# Patient Record
Sex: Male | Born: 1975 | Race: White | Hispanic: No | Marital: Married | Smoking: Never smoker
Health system: Southern US, Community
[De-identification: ages and names within clinical notes are randomized; demographics above are authoritative.]

## PROBLEM LIST (undated history)

## (undated) DIAGNOSIS — K5792 Diverticulitis of intestine, part unspecified, without perforation or abscess without bleeding: Secondary | ICD-10-CM

## (undated) DIAGNOSIS — K219 Gastro-esophageal reflux disease without esophagitis: Secondary | ICD-10-CM

## (undated) DIAGNOSIS — T7840XA Allergy, unspecified, initial encounter: Secondary | ICD-10-CM

## (undated) DIAGNOSIS — E785 Hyperlipidemia, unspecified: Secondary | ICD-10-CM

## (undated) DIAGNOSIS — G4733 Obstructive sleep apnea (adult) (pediatric): Secondary | ICD-10-CM

## (undated) DIAGNOSIS — J302 Other seasonal allergic rhinitis: Secondary | ICD-10-CM

## (undated) HISTORY — DX: Obstructive sleep apnea (adult) (pediatric): G47.33

## (undated) HISTORY — DX: Hyperlipidemia, unspecified: E78.5

## (undated) HISTORY — DX: Gastro-esophageal reflux disease without esophagitis: K21.9

## (undated) HISTORY — DX: Allergy, unspecified, initial encounter: T78.40XA

## (undated) HISTORY — DX: Diverticulitis of intestine, part unspecified, without perforation or abscess without bleeding: K57.92

---

## 2015-03-20 ENCOUNTER — Encounter (HOSPITAL_COMMUNITY): Payer: Self-pay | Admitting: *Deleted

## 2015-03-20 ENCOUNTER — Emergency Department (HOSPITAL_COMMUNITY): Payer: Managed Care, Other (non HMO)

## 2015-03-20 ENCOUNTER — Inpatient Hospital Stay (HOSPITAL_COMMUNITY)
Admission: EM | Admit: 2015-03-20 | Discharge: 2015-03-23 | DRG: 392 | Disposition: A | Discharge status: Home / Self Care | Payer: Managed Care, Other (non HMO) | Attending: General Surgery | Admitting: General Surgery

## 2015-03-20 DIAGNOSIS — E875 Hyperkalemia: Secondary | ICD-10-CM | POA: Diagnosis present

## 2015-03-20 DIAGNOSIS — K631 Perforation of intestine (nontraumatic): Secondary | ICD-10-CM

## 2015-03-20 DIAGNOSIS — K5780 Diverticulitis of intestine, part unspecified, with perforation and abscess without bleeding: Secondary | ICD-10-CM

## 2015-03-20 DIAGNOSIS — R102 Pelvic and perineal pain: Secondary | ICD-10-CM | POA: Diagnosis not present

## 2015-03-20 DIAGNOSIS — K5732 Diverticulitis of large intestine without perforation or abscess without bleeding: Secondary | ICD-10-CM

## 2015-03-20 DIAGNOSIS — K5720 Diverticulitis of large intestine with perforation and abscess without bleeding: Secondary | ICD-10-CM | POA: Diagnosis present

## 2015-03-20 HISTORY — DX: Other seasonal allergic rhinitis: J30.2

## 2015-03-20 LAB — URINALYSIS, ROUTINE W REFLEX MICROSCOPIC
BILIRUBIN URINE: NEGATIVE
GLUCOSE, UA: NEGATIVE mg/dL
Ketones, ur: NEGATIVE mg/dL
Leukocytes, UA: NEGATIVE
Nitrite: NEGATIVE
PROTEIN: NEGATIVE mg/dL
Specific Gravity, Urine: 1.017 (ref 1.005–1.030)
pH: 7.5 (ref 5.0–8.0)

## 2015-03-20 LAB — URINE MICROSCOPIC-ADD ON: BACTERIA UA: NONE SEEN

## 2015-03-20 LAB — COMPREHENSIVE METABOLIC PANEL
ALK PHOS: 102 U/L (ref 38–126)
ALT: 37 U/L (ref 17–63)
ANION GAP: 10 (ref 5–15)
AST: 24 U/L (ref 15–41)
Albumin: 3.2 g/dL — ABNORMAL LOW (ref 3.5–5.0)
BILIRUBIN TOTAL: 0.4 mg/dL (ref 0.3–1.2)
BUN: 12 mg/dL (ref 6–20)
CALCIUM: 9.1 mg/dL (ref 8.9–10.3)
CO2: 27 mmol/L (ref 22–32)
CREATININE: 1.2 mg/dL (ref 0.61–1.24)
Chloride: 99 mmol/L — ABNORMAL LOW (ref 101–111)
GLUCOSE: 100 mg/dL — AB (ref 65–99)
Potassium: 3.7 mmol/L (ref 3.5–5.1)
SODIUM: 136 mmol/L (ref 135–145)
Total Protein: 7.8 g/dL (ref 6.5–8.1)

## 2015-03-20 LAB — CBC
HCT: 41.7 % (ref 39.0–52.0)
Hemoglobin: 13.8 g/dL (ref 13.0–17.0)
MCH: 28.5 pg (ref 26.0–34.0)
MCHC: 33.1 g/dL (ref 30.0–36.0)
MCV: 86.2 fL (ref 78.0–100.0)
PLATELETS: 281 10*3/uL (ref 150–400)
RBC: 4.84 MIL/uL (ref 4.22–5.81)
RDW: 13.1 % (ref 11.5–15.5)
WBC: 17.1 10*3/uL — ABNORMAL HIGH (ref 4.0–10.5)

## 2015-03-20 LAB — LIPASE, BLOOD: Lipase: 26 U/L (ref 11–51)

## 2015-03-20 MED ORDER — ONDANSETRON HCL 4 MG/2ML IJ SOLN: 4.0000 mg | Freq: Four times a day (QID) | INTRAMUSCULAR | Status: DC | PRN

## 2015-03-20 MED ORDER — HYDROMORPHONE HCL 1 MG/ML IJ SOLN
1.0000 mg | Freq: Once | INTRAMUSCULAR | Status: AC
  Administered 2015-03-20: 1 mg via INTRAVENOUS
  Filled 2015-03-20: qty 1

## 2015-03-20 MED ORDER — ACETAMINOPHEN 650 MG RE SUPP: 650.0000 mg | Freq: Four times a day (QID) | RECTAL | Status: DC | PRN

## 2015-03-20 MED ORDER — DIPHENHYDRAMINE HCL 25 MG PO CAPS: 25.0000 mg | ORAL_CAPSULE | Freq: Four times a day (QID) | ORAL | Status: DC | PRN

## 2015-03-20 MED ORDER — FLUTICASONE PROPIONATE 50 MCG/ACT NA SUSP
1.0000 | Freq: Every day | NASAL | Status: DC
  Filled 2015-03-20: qty 16

## 2015-03-20 MED ORDER — ONDANSETRON HCL 4 MG/2ML IJ SOLN
4.0000 mg | Freq: Once | INTRAMUSCULAR | Status: AC
  Administered 2015-03-20: 4 mg via INTRAVENOUS
  Filled 2015-03-20: qty 2

## 2015-03-20 MED ORDER — ZOLPIDEM TARTRATE 5 MG PO TABS: 5.0000 mg | ORAL_TABLET | Freq: Every evening | ORAL | Status: DC | PRN

## 2015-03-20 MED ORDER — ENOXAPARIN SODIUM 40 MG/0.4ML ~~LOC~~ SOLN
40.0000 mg | SUBCUTANEOUS | Status: DC
  Administered 2015-03-20: 40 mg via SUBCUTANEOUS
  Filled 2015-03-20 (×2): qty 0.4

## 2015-03-20 MED ORDER — DIPHENHYDRAMINE HCL 50 MG/ML IJ SOLN: 25.0000 mg | Freq: Four times a day (QID) | INTRAMUSCULAR | Status: DC | PRN

## 2015-03-20 MED ORDER — PIPERACILLIN-TAZOBACTAM 3.375 G IVPB
3.3750 g | Freq: Three times a day (TID) | INTRAVENOUS | Status: DC
  Administered 2015-03-20 – 2015-03-23 (×8): 3.375 g via INTRAVENOUS
  Filled 2015-03-20 (×10): qty 50

## 2015-03-20 MED ORDER — ACETAMINOPHEN 325 MG PO TABS: 650.0000 mg | ORAL_TABLET | Freq: Four times a day (QID) | ORAL | Status: DC | PRN

## 2015-03-20 MED ORDER — SIMETHICONE 80 MG PO CHEW
40.0000 mg | CHEWABLE_TABLET | Freq: Four times a day (QID) | ORAL | Status: DC | PRN
  Administered 2015-03-21: 40 mg via ORAL
  Filled 2015-03-20: qty 1

## 2015-03-20 MED ORDER — POTASSIUM CHLORIDE IN NACL 20-0.9 MEQ/L-% IV SOLN
INTRAVENOUS | Status: DC
  Administered 2015-03-20 – 2015-03-21 (×2): via INTRAVENOUS
  Filled 2015-03-20 (×2): qty 1000

## 2015-03-20 MED ORDER — PANTOPRAZOLE SODIUM 40 MG IV SOLR
40.0000 mg | Freq: Every day | INTRAVENOUS | Status: DC
  Administered 2015-03-20 – 2015-03-22 (×3): 40 mg via INTRAVENOUS
  Filled 2015-03-20 (×3): qty 40

## 2015-03-20 MED ORDER — ONDANSETRON 4 MG PO TBDP: 4.0000 mg | ORAL_TABLET | Freq: Four times a day (QID) | ORAL | Status: DC | PRN

## 2015-03-20 MED ORDER — HYDROCODONE-ACETAMINOPHEN 5-325 MG PO TABS
1.0000 | ORAL_TABLET | Freq: Once | ORAL | Status: DC
  Filled 2015-03-20: qty 1

## 2015-03-20 MED ORDER — AZELASTINE HCL 0.1 % NA SOLN
1.0000 | Freq: Two times a day (BID) | NASAL | Status: DC
  Filled 2015-03-20: qty 30

## 2015-03-20 MED ORDER — HYDROMORPHONE HCL 1 MG/ML IJ SOLN
1.0000 mg | INTRAMUSCULAR | Status: DC | PRN
  Administered 2015-03-20 – 2015-03-21 (×2): 1 mg via INTRAVENOUS
  Filled 2015-03-20 (×3): qty 1

## 2015-03-20 NOTE — ED Notes (Signed)
Pt unable to give urine sample at triage.

## 2015-03-20 NOTE — ED Provider Notes (Signed)
CSN: [id number]     Arrival date & time 03/20/15  1407 History   First MD Initiated Contact with Patient 03/20/15 1526     Chief Complaint  Patient presents with  . Abdominal Pain     (Consider location/radiation/quality/duration/timing/severity/associated sxs/prior Treatment) HPI   Odai Wimmer is a 39 y.o. male with abdominal pain for 9 days. Saw PCP and had blood in urine, treated with Septra. Taking Motrin for pain. He has constipation. Constant suprapubic pain, with pain worse with voiding, at end of stream. No history of kidney stones. Mother has kidney stones. Patient's pain is severe at the time of evaluation. His PCP did a prostate exam. He is evaluated. It was normal. Patient's wife states that his urine culture was negative for bacterial growth. There are no other known modifying factors.    Past Medical History  Diagnosis Date  . Seasonal allergies    History reviewed. No pertinent past surgical history. History reviewed. No pertinent family history. Social History  Substance Use Topics  . Smoking status: Never Smoker   . Smokeless tobacco: None  . Alcohol Use: No    Review of Systems  All other systems reviewed and are negative.     Allergies  Amoxicillin  Home Medications   Prior to Admission medications   Medication Sig Start Date End Date Taking? Authorizing Provider  acetaminophen (TYLENOL) 325 MG tablet Take 650 mg by mouth every 6 (six) hours as needed for mild pain.   Yes Historical Provider, MD  azelastine (ASTELIN) 0.1 % nasal spray Place 1 spray into both nostrils 2 (two) times daily. Use in each nostril as directed   Yes Historical Provider, MD  fluticasone (FLONASE) 50 MCG/ACT nasal spray Place 1 spray into both nostrils daily.   Yes Historical Provider, MD  ibuprofen (ADVIL,MOTRIN) 400 MG tablet Take 400 mg by mouth every 6 (six) hours as needed for mild pain.   Yes Historical Provider, MD  sulfamethoxazole-trimethoprim (BACTRIM DS,SEPTRA DS)  800-160 MG tablet Take 1 tablet by mouth 2 (two) times daily. 7 day supply started 11/29 03/15/15  Yes Historical Provider, MD   BP 123/90 mmHg  Pulse 99  Temp(Src) 98 F (36.7 C) (Oral)  Resp 18  Ht  (1.778 m)  Wt 200 lb 2 oz (90.776 kg)  BMI 28.71 kg/m2  SpO2 99% Physical Exam  Constitutional: He is oriented to person, place, and time. He appears well-developed and well-nourished.  HENT:  Head: Normocephalic and atraumatic.  Right Ear: External ear normal.  Left Ear: External ear normal.  Eyes: Conjunctivae and EOM are normal. Pupils are equal, round, and reactive to light.  Neck: Normal range of motion and phonation normal. Neck supple.  Cardiovascular: Normal rate, regular rhythm and normal heart sounds.   Pulmonary/Chest: Effort normal and breath sounds normal. He exhibits no bony tenderness.  Abdominal: Soft. He exhibits no mass. There is tenderness (right suprapubic, moderate). There is guarding. There is no rebound.  Genitourinary:  No costovertebral angle tenderness with percussion  Musculoskeletal: Normal range of motion.  Neurological: He is alert and oriented to person, place, and time. No cranial nerve deficit or sensory deficit. He exhibits normal muscle tone. Coordination normal.  Skin: Skin is warm, dry and intact.  Psychiatric: He has a normal mood and affect. His behavior is normal. Judgment and thought content normal.  Nursing note and vitals reviewed.   ED Course  Procedures (including critical care time)  Medications  ondansetron (ZOFRAN) injection 4 mg (4  mg Intravenous Given 03/20/15 1722)  HYDROmorphone (DILAUDID) injection 1 mg (1 mg Intravenous Given 03/20/15 1732)  HYDROmorphone (DILAUDID) injection 1 mg (1 mg Intravenous Given 03/20/15 1933)    Patient Vitals for the past 24 hrs:  BP Temp Temp src Pulse Resp SpO2 Height Weight  03/20/15 1830 123/90 mmHg - - 99 - 99 % - -  03/20/15 1800 126/93 mmHg - - - - - - -  03/20/15 1730 137/100 mmHg - -  107 - 99 % - -  03/20/15 1700 120/83 mmHg - - 102 - 100 % - -  03/20/15 1630 131/95 mmHg - - 95 - 100 % - -  03/20/15 1600 131/97 mmHg - - 105 - 97 % - -  03/20/15 1422 139/97 mmHg 98 F (36.7 C) Oral 113 18 98 % 5\' 10"  (1.778 m) 200 lb 2 oz (90.776 kg)   21:25- case discussed with general surgery, Dr. Corliss Skainssuei will evaluate and admit the patient.   7:29 PM Reevaluation with update and discussion. After initial assessment and treatment, an updated evaluation reveals he states his pain is coming back somewhat. Findings discussed with the patient, all questions were answered. Dewane Timson L    Labs Review Labs Reviewed  COMPREHENSIVE METABOLIC PANEL - Abnormal; Notable for the following:    Chloride 99 (*)    Glucose, Bld 100 (*)    Albumin 3.2 (*)    All other components within normal limits  CBC - Abnormal; Notable for the following:    WBC 17.1 (*)    All other components within normal limits  URINALYSIS, ROUTINE W REFLEX MICROSCOPIC (NOT AT [facility]) - Abnormal; Notable for the following:    Hgb urine dipstick MODERATE (*)    All other components within normal limits  URINE MICROSCOPIC-ADD ON - Abnormal; Notable for the following:    Squamous Epithelial / LPF 0-5 (*)    All other components within normal limits  LIPASE, BLOOD    Imaging Review Ct Renal Stone Study  03/20/2015  CLINICAL DATA:  Acute onset of right lower quadrant abdominal pain and hematuria. Initial encounter. EXAM: CT ABDOMEN AND PELVIS WITHOUT CONTRAST TECHNIQUE: Multidetector CT imaging of the abdomen and pelvis was performed following the standard protocol without IV contrast. COMPARISON:  None. FINDINGS: The visualized lung bases are clear. The liver and spleen are unremarkable in appearance. The gallbladder is within normal limits. The pancreas and adrenal glands are unremarkable. The kidneys are unremarkable in appearance. There is no evidence of hydronephrosis. No renal or ureteral stones are seen. No perinephric  stranding is appreciated. No free fluid is identified. The small bowel is unremarkable in appearance. The stomach is within normal limits. No acute vascular abnormalities are seen. Acute diverticulitis is noted along the mid sigmoid colon, with mild wall thickening, diffuse surrounding soft tissue inflammation and trace fluid. There appears to be focal perforation extending superiorly from the mid sigmoid colon, with resultant formation of an abscess at the upper pelvis, measuring approximately 5.3 x 4.0 x 3.3 cm, containing fluid and air. Surrounding soft tissue inflammation is noted extending minimally along the retroperitoneum. The appendix is difficult to fully assess as it tracks adjacent to the abscess, though the normal appearance of the base of the appendix suggests against appendicitis. The colon is otherwise unremarkable in appearance, aside from mild diverticulosis along the sigmoid colon. The bladder is mildly distended and grossly unremarkable. The prostate remains normal in size. No inguinal lymphadenopathy is seen. No acute osseous abnormalities are  identified. IMPRESSION: 1. Acute diverticulitis along the mid sigmoid colon, with diffuse surrounding soft tissue inflammation and trace fluid. Apparent focal perforation extending superiorly from the mid sigmoid colon, with formation of an abscess at the upper pelvis, measuring approximately 5.3 x 4.0 x 3.3 cm, containing fluid and air. Surrounding soft tissue inflammation extends minimally along the retroperitoneum. 2. The appendix is difficult to fully assess, as it tracks adjacent to the abscess, though the normal appearance of the base of the appendix and the tract from the mid sigmoid colon suggests against appendicitis. These results were called by telephone at the time of interpretation on 03/20/2015 at 7:06 pm to Dr. Mancel Bale, who verbally acknowledged these results. Electronically Signed   By: Roanna Raider M.D.   On: 03/20/2015 19:08   I  have personally reviewed and evaluated these images and lab results as part of my medical decision-making.   EKG Interpretation None      MDM   Final diagnoses:  Sigmoid diverticulitis  Intestinal perforation (HCC)    Perforated sigmoid diverticulitis, with abscess. Patient is not septic. Incidental hematuria, is nonspecific and is likely reactive. He will require admission for further treatment, likely abscess drainage and consideration for surgical drainage.  Nursing Notes Reviewed/ Care Coordinated, and agree without changes. Applicable Imaging Reviewed.  Interpretation of Laboratory Data incorporated into ED treatment  Plan: Admit    Mancel Bale, MD 03/20/15 1942

## 2015-03-20 NOTE — H&P (Signed)
Donald Crawford is an 39 y.o. male.   Chief Complaint: Suprapubic abdominal pain HPI: This is a 40 yo male in good health who began having some low suprapubic/ pelvic tenderness about 9 days ago.  This was associated with some burning when his bladder was full, but slightly relieved with urination.  He was examined by his PCP five days ago, and was diagnosed with UTI and started on Bactrim DS.  He felt slightly better.  He has been constipated for the last couple of days, so he used a suppository and drank a bottle of Mg Citrate yesterday evening.  He had good results, but subsequently developed severe pain in his lower pelvic/ suprapubic region.  He has not had fever.  Poor appetite yesterday, has had a little bit to eat today.  No melena or hematochezia.  Past Medical History  Diagnosis Date  . Seasonal allergies     History reviewed. No pertinent past surgical history.  History reviewed. No pertinent family history. Social History:  reports that he has never smoked. He does not have any smokeless tobacco history on file. He reports that he does not drink alcohol or use illicit drugs.  Allergies:  Allergies  Allergen Reactions  . Amoxicillin     Oral sores  His wife states that he does not have a true PCN allergy, but developed some oral sores after using Amoxicillin for a dental procedure.  Prior to Admission medications   Medication Sig Start Date End Date Taking? Authorizing Provider  acetaminophen (TYLENOL) 325 MG tablet Take 650 mg by mouth every 6 (six) hours as needed for mild pain.   Yes Historical Provider, MD  azelastine (ASTELIN) 0.1 % nasal spray Place 1 spray into both nostrils 2 (two) times daily. Use in each nostril as directed   Yes Historical Provider, MD  fluticasone (FLONASE) 50 MCG/ACT nasal spray Place 1 spray into both nostrils daily.   Yes Historical Provider, MD  ibuprofen (ADVIL,MOTRIN) 400 MG tablet Take 400 mg by mouth every 6 (six) hours as needed for mild pain.    Yes Historical Provider, MD  sulfamethoxazole-trimethoprim (BACTRIM DS,SEPTRA DS) 800-160 MG tablet Take 1 tablet by mouth 2 (two) times daily. 7 day supply started 11/29 03/15/15  Yes Historical Provider, MD     Results for orders placed or performed during the hospital encounter of 03/20/15 (from the past 48 hour(s))  Urinalysis, Routine w reflex microscopic (not at [facility])     Status: Abnormal   Collection Time: 03/20/15  2:23 PM  Result Value Ref Range   Color, Urine YELLOW YELLOW   APPearance CLEAR CLEAR   Specific Gravity, Urine 1.017 1.005 - 1.030   pH 7.5 5.0 - 8.0   Glucose, UA NEGATIVE NEGATIVE mg/dL   Hgb urine dipstick MODERATE (A) NEGATIVE   Bilirubin Urine NEGATIVE NEGATIVE   Ketones, ur NEGATIVE NEGATIVE mg/dL   Protein, ur NEGATIVE NEGATIVE mg/dL   Nitrite NEGATIVE NEGATIVE   Leukocytes, UA NEGATIVE NEGATIVE  Urine microscopic-add on     Status: Abnormal   Collection Time: 03/20/15  2:23 PM  Result Value Ref Range   Squamous Epithelial / LPF 0-5 (A) NONE SEEN   WBC, UA 0-5 0 - 5 WBC/hpf   RBC / HPF 6-30 0 - 5 RBC/hpf   Bacteria, UA NONE SEEN NONE SEEN  Lipase, blood     Status: None   Collection Time: 03/20/15  2:29 PM  Result Value Ref Range   Lipase 26 11 - 51  U/L  Comprehensive metabolic panel     Status: Abnormal   Collection Time: 03/20/15  2:29 PM  Result Value Ref Range   Sodium 136 135 - 145 mmol/L   Potassium 3.7 3.5 - 5.1 mmol/L   Chloride 99 (L) 101 - 111 mmol/L   CO2 27 22 - 32 mmol/L   Glucose, Bld 100 (H) 65 - 99 mg/dL   BUN 12 6 - 20 mg/dL   Creatinine, Ser 1.20 0.61 - 1.24 mg/dL   Calcium 9.1 8.9 - 10.3 mg/dL   Total Protein 7.8 6.5 - 8.1 g/dL   Albumin 3.2 (L) 3.5 - 5.0 g/dL   AST 24 15 - 41 U/L   ALT 37 17 - 63 U/L   Alkaline Phosphatase 102 38 - 126 U/L   Total Bilirubin 0.4 0.3 - 1.2 mg/dL   GFR calc non Af Amer >60 >60 mL/min   GFR calc Af Amer >60 >60 mL/min    Comment: (NOTE) The eGFR has been calculated using the CKD EPI  equation. This calculation has not been validated in all clinical situations. eGFR's persistently <60 mL/min signify possible Chronic Kidney Disease.    Anion gap 10 5 - 15  CBC     Status: Abnormal   Collection Time: 03/20/15  2:29 PM  Result Value Ref Range   WBC 17.1 (H) 4.0 - 10.5 K/uL   RBC 4.84 4.22 - 5.81 MIL/uL   Hemoglobin 13.8 13.0 - 17.0 g/dL   HCT 41.7 39.0 - 52.0 %   MCV 86.2 78.0 - 100.0 fL   MCH 28.5 26.0 - 34.0 pg   MCHC 33.1 30.0 - 36.0 g/dL   RDW 13.1 11.5 - 15.5 %   Platelets 281 150 - 400 K/uL   Ct Renal Stone Study  03/20/2015  CLINICAL DATA:  Acute onset of right lower quadrant abdominal pain and hematuria. Initial encounter. EXAM: CT ABDOMEN AND PELVIS WITHOUT CONTRAST TECHNIQUE: Multidetector CT imaging of the abdomen and pelvis was performed following the standard protocol without IV contrast. COMPARISON:  None. FINDINGS: The visualized lung bases are clear. The liver and spleen are unremarkable in appearance. The gallbladder is within normal limits. The pancreas and adrenal glands are unremarkable. The kidneys are unremarkable in appearance. There is no evidence of hydronephrosis. No renal or ureteral stones are seen. No perinephric stranding is appreciated. No free fluid is identified. The small bowel is unremarkable in appearance. The stomach is within normal limits. No acute vascular abnormalities are seen. Acute diverticulitis is noted along the mid sigmoid colon, with mild wall thickening, diffuse surrounding soft tissue inflammation and trace fluid. There appears to be focal perforation extending superiorly from the mid sigmoid colon, with resultant formation of an abscess at the upper pelvis, measuring approximately 5.3 x 4.0 x 3.3 cm, containing fluid and air. Surrounding soft tissue inflammation is noted extending minimally along the retroperitoneum. The appendix is difficult to fully assess as it tracks adjacent to the abscess, though the normal appearance of  the base of the appendix suggests against appendicitis. The colon is otherwise unremarkable in appearance, aside from mild diverticulosis along the sigmoid colon. The bladder is mildly distended and grossly unremarkable. The prostate remains normal in size. No inguinal lymphadenopathy is seen. No acute osseous abnormalities are identified. IMPRESSION: 1. Acute diverticulitis along the mid sigmoid colon, with diffuse surrounding soft tissue inflammation and trace fluid. Apparent focal perforation extending superiorly from the mid sigmoid colon, with formation of an abscess at the upper   pelvis, measuring approximately 5.3 x 4.0 x 3.3 cm, containing fluid and air. Surrounding soft tissue inflammation extends minimally along the retroperitoneum. 2. The appendix is difficult to fully assess, as it tracks adjacent to the abscess, though the normal appearance of the base of the appendix and the tract from the mid sigmoid colon suggests against appendicitis. These results were called by telephone at the time of interpretation on 03/20/2015 at 7:06 pm to Dr. ELLIOTT WENTZ, who verbally acknowledged these results. Electronically Signed   By: Jeffery  Chang M.D.   On: 03/20/2015 19:08    Review of Systems  Constitutional: Negative for weight loss.  HENT: Negative for ear discharge, ear pain, hearing loss and tinnitus.   Eyes: Negative for blurred vision, double vision, photophobia and pain.  Respiratory: Negative for cough, sputum production and shortness of breath.   Cardiovascular: Negative for chest pain.  Gastrointestinal: Positive for abdominal pain and constipation. Negative for nausea and vomiting.  Genitourinary: Positive for dysuria. Negative for urgency, frequency and flank pain.  Musculoskeletal: Negative for myalgias, back pain, joint pain, falls and neck pain.  Neurological: Negative for dizziness, tingling, sensory change, focal weakness, loss of consciousness and headaches.  Endo/Heme/Allergies: Does  not bruise/bleed easily.  Psychiatric/Behavioral: Negative for depression, memory loss and substance abuse. The patient is not nervous/anxious.     Blood pressure 123/90, pulse 99, temperature 98 F (36.7 C), temperature source Oral, resp. rate 18, height 5' 10" (1.778 m), weight 90.776 kg (200 lb 2 oz), SpO2 99 %. Physical Exam  WDWN in NAD HEENT:  EOMI, sclera anicteric Neck:  No masses, no thyromegaly Lungs:  CTA bilaterally; normal respiratory effort CV:  Regular rate and rhythm; no murmurs Abd:  +bowel sounds, soft; tender in suprapubic region; minimal guarding; no palpable masses Ext:  Well-perfused; no edema Skin:  Warm, dry; no sign of jaundice  Assessment/Plan Sigmoid diverticulitis with probable perforation and abscess formation  Will admit for IV antibiotics, NPO.  Will ask IR to evaluate for possible percutaneous drainage.  If they are unable to drain or if his clinical status worsens, he may need urgent sigmoid colectomy with probable temporary colostomy/ Hartmann's pouch.  I discussed this thoroughly with the patient and his wife, who is a nurse here at [facility].  TSUEI,MATTHEW K. 03/20/2015, 8:16 PM    

## 2015-03-20 NOTE — ED Notes (Signed)
Called 6N to give report.  Receiving RN unable to take report at this time. 

## 2015-03-20 NOTE — ED Notes (Signed)
Pt reports lower abd pain, has been to pcp and started on antibiotics. Still having severe pain which he thought was related to constipation, but he took laxatives and had bowel movement, still has severe lower abd pain. Denies fever or n/v.

## 2015-03-21 LAB — BASIC METABOLIC PANEL
Anion gap: 6 (ref 5–15)
BUN: 11 mg/dL (ref 6–20)
CHLORIDE: 101 mmol/L (ref 101–111)
CO2: 29 mmol/L (ref 22–32)
CREATININE: 1.36 mg/dL — AB (ref 0.61–1.24)
Calcium: 9 mg/dL (ref 8.9–10.3)
GFR calc non Af Amer: >60 mL/min (ref >60)
Glucose, Bld: 109 mg/dL — ABNORMAL HIGH (ref 65–99)
POTASSIUM: 5.2 mmol/L — AB (ref 3.5–5.1)
Sodium: 136 mmol/L (ref 135–145)

## 2015-03-21 LAB — APTT: APTT: 32 s (ref 24–37)

## 2015-03-21 LAB — CBC
HEMATOCRIT: 39.2 % (ref 39.0–52.0)
HEMOGLOBIN: 12.9 g/dL — AB (ref 13.0–17.0)
MCH: 28.4 pg (ref 26.0–34.0)
MCHC: 32.9 g/dL (ref 30.0–36.0)
MCV: 86.3 fL (ref 78.0–100.0)
Platelets: 273 10*3/uL (ref 150–400)
RBC: 4.54 MIL/uL (ref 4.22–5.81)
RDW: 13.3 % (ref 11.5–15.5)
WBC: 17.3 10*3/uL — ABNORMAL HIGH (ref 4.0–10.5)

## 2015-03-21 LAB — PROTIME-INR
INR: 1.1 (ref 0.00–1.49)
PROTHROMBIN TIME: 14.4 s (ref 11.6–15.2)

## 2015-03-21 MED ORDER — CHLORHEXIDINE GLUCONATE 0.12 % MT SOLN
15.0000 mL | Freq: Two times a day (BID) | OROMUCOSAL | Status: DC
  Administered 2015-03-21 (×2): 15 mL via OROMUCOSAL
  Filled 2015-03-21 (×2): qty 15

## 2015-03-21 MED ORDER — CETYLPYRIDINIUM CHLORIDE 0.05 % MT LIQD
7.0000 mL | Freq: Two times a day (BID) | OROMUCOSAL | Status: DC
  Administered 2015-03-21: 7 mL via OROMUCOSAL

## 2015-03-21 MED ORDER — DEXTROSE-NACL 5-0.9 % IV SOLN
INTRAVENOUS | Status: DC
  Administered 2015-03-21: 10:00:00 via INTRAVENOUS

## 2015-03-21 NOTE — Progress Notes (Signed)
Patient ID: Harlene SaltsDavid Crawford, male   DOB: [birth year], 39 y.o.   MRN: [id number] Request received for CT guided drainage of diverticular abscess in pt. Imaging studies were reviewed by Dr. Loreta AveWagner and he states area in question is primarily a phlegmon at this stage. He recommends f/u CT with contrast in 2-3 days and continued antibiotic therapy. Please page Dr. Loreta AveWagner at [phone] with any additional questions.

## 2015-03-21 NOTE — Progress Notes (Signed)
Patient ID: Donald Crawford, male   DOB: 1975-08-17, 39 y.o.   MRN: [id number]     [address] [place], [place] [address]    Phone: [phone] FAX: [phone]     Subjective: No pain since 2200 until early this AM while trying to void.  No pneumaturia or stool in urine.  Passing flatus.  WBC essentially unchanged. Afebrile.    Objective:  Vital signs:  Filed Vitals:   03/20/15 2030 03/20/15 2117 03/21/15 0116 03/21/15 0613  BP: 124/90 133/90 114/67 126/86  Pulse: 98 113 95 85  Temp:  99 F (37.2 C) 98.4 F (36.9 C) 98.8 F (37.1 C)  TempSrc:  Oral  Oral  Resp:  _0 Height:  _1  (1.778 m)    Weight:  88.4 kg (194 lb 14.2 oz)    SpO2: 92% 98% 98% 100%    Last BM Date: 03/20/15  Intake/Output   Yesterday:  12/04 0701 - 12/05 0700 In: 945.8 [I.V.:945.8] Out: 1300 [Urine:1300] This shift: I/O last 3 completed shifts: In: 945.8 [I.V.:945.8] Out: 1300 [Urine:1300]    Physical Exam: General: Pt awake/alert/oriented x4 in no acute distress Chest: cta.  No chest wall pain w good excursion CV:  Pulses intact.  Regular rhythm Abdomen: Soft.  Nondistended.  Mild ttp llq(pain meds on board)  No evidence of peritonitis.  No incarcerated hernias. Ext:  SCDs BLE.  No mjr edema.  No cyanosis Skin: No petechiae / purpura   Problem List:   Active Problems:   Diverticulitis of large intestine with perforation and abscess    Results:   Labs: Results for orders placed or performed during the hospital encounter of 03/20/15 (from the past 48 hour(s))  Urinalysis, Routine w reflex microscopic (not at [facility])     Status: Abnormal   Collection Time: 03/20/15  2:23 PM  Result Value Ref Range   Color, Urine YELLOW YELLOW   APPearance CLEAR CLEAR   Specific Gravity, Urine 1.017 1.005 - 1.030   pH 7.5 5.0 - 8.0   Glucose, UA NEGATIVE NEGATIVE mg/dL   Hgb urine dipstick MODERATE (A) NEGATIVE   Bilirubin  Urine NEGATIVE NEGATIVE   Ketones, ur NEGATIVE NEGATIVE mg/dL   Protein, ur NEGATIVE NEGATIVE mg/dL   Nitrite NEGATIVE NEGATIVE   Leukocytes, UA NEGATIVE NEGATIVE  Urine microscopic-add on     Status: Abnormal   Collection Time: 03/20/15  2:23 PM  Result Value Ref Range   Squamous Epithelial / LPF 0-5 (A) NONE SEEN   WBC, UA 0-5 0 - 5 WBC/hpf   RBC / HPF 6-30 0 - 5 RBC/hpf   Bacteria, UA NONE SEEN NONE SEEN  Lipase, blood     Status: None   Collection Time: 03/20/15  2:29 PM  Result Value Ref Range   Lipase 26 11 - 51 U/L  Comprehensive metabolic panel     Status: Abnormal   Collection Time: 03/20/15  2:29 PM  Result Value Ref Range   Sodium 136 135 - 145 mmol/L   Potassium 3.7 3.5 - 5.1 mmol/L   Chloride 99 (L) 101 - 111 mmol/L   CO2 27 22 - 32 mmol/L   Glucose, Bld 100 (H) 65 - 99 mg/dL   BUN 12 6 - 20 mg/dL   Creatinine, Ser 1.20 0.61 - 1.24 mg/dL   Calcium 9.1 8.9 - 10.3 mg/dL   Total Protein 7.8 6.5 - 8.1 g/dL  Albumin 3.2 (L) 3.5 - 5.0 g/dL   AST 24 15 - 41 U/L   ALT 37 17 - 63 U/L   Alkaline Phosphatase 102 38 - 126 U/L   Total Bilirubin 0.4 0.3 - 1.2 mg/dL   GFR calc non Af Amer >60 >60 mL/min   GFR calc Af Amer >60 >60 mL/min    Comment: (NOTE) The eGFR has been calculated using the CKD EPI equation. This calculation has not been validated in all clinical situations. eGFR's persistently <60 mL/min signify possible Chronic Kidney Disease.    Anion gap 10 5 - 15  CBC     Status: Abnormal   Collection Time: 03/20/15  2:29 PM  Result Value Ref Range   WBC 17.1 (H) 4.0 - 10.5 K/uL   RBC 4.84 4.22 - 5.81 MIL/uL   Hemoglobin 13.8 13.0 - 17.0 g/dL   HCT 41.7 39.0 - 52.0 %   MCV 86.2 78.0 - 100.0 fL   MCH 28.5 26.0 - 34.0 pg   MCHC 33.1 30.0 - 36.0 g/dL   RDW 13.1 11.5 - 15.5 %   Platelets 281 150 - 400 K/uL  CBC     Status: Abnormal   Collection Time: 03/21/15  4:01 AM  Result Value Ref Range   WBC 17.3 (H) 4.0 - 10.5 K/uL   RBC 4.54 4.22 - 5.81 MIL/uL    Hemoglobin 12.9 (L) 13.0 - 17.0 g/dL   HCT 39.2 39.0 - 52.0 %   MCV 86.3 78.0 - 100.0 fL   MCH 28.4 26.0 - 34.0 pg   MCHC 32.9 30.0 - 36.0 g/dL   RDW 13.3 11.5 - 15.5 %   Platelets 273 150 - 400 K/uL  Basic metabolic panel     Status: Abnormal   Collection Time: 03/21/15  4:01 AM  Result Value Ref Range   Sodium 136 135 - 145 mmol/L   Potassium 5.2 (H) 3.5 - 5.1 mmol/L    Comment: SLIGHT HEMOLYSIS   Chloride 101 101 - 111 mmol/L   CO2 29 22 - 32 mmol/L   Glucose, Bld 109 (H) 65 - 99 mg/dL   BUN 11 6 - 20 mg/dL   Creatinine, Ser 1.36 (H) 0.61 - 1.24 mg/dL   Calcium 9.0 8.9 - 10.3 mg/dL   GFR calc non Af Amer >60 >60 mL/min   GFR calc Af Amer >60 >60 mL/min    Comment: (NOTE) The eGFR has been calculated using the CKD EPI equation. This calculation has not been validated in all clinical situations. eGFR's persistently <60 mL/min signify possible Chronic Kidney Disease.    Anion gap 6 5 - 15    Imaging / Studies: Ct Renal Stone Study  03/20/2015  CLINICAL DATA:  Acute onset of right lower quadrant abdominal pain and hematuria. Initial encounter. EXAM: CT ABDOMEN AND PELVIS WITHOUT CONTRAST TECHNIQUE: Multidetector CT imaging of the abdomen and pelvis was performed following the standard protocol without IV contrast. COMPARISON:  None. FINDINGS: The visualized lung bases are clear. The liver and spleen are unremarkable in appearance. The gallbladder is within normal limits. The pancreas and adrenal glands are unremarkable. The kidneys are unremarkable in appearance. There is no evidence of hydronephrosis. No renal or ureteral stones are seen. No perinephric stranding is appreciated. No free fluid is identified. The small bowel is unremarkable in appearance. The stomach is within normal limits. No acute vascular abnormalities are seen. Acute diverticulitis is noted along the mid sigmoid colon, with mild wall thickening, diffuse surrounding  soft tissue inflammation and trace fluid. There  appears to be focal perforation extending superiorly from the mid sigmoid colon, with resultant formation of an abscess at the upper pelvis, measuring approximately 5.3 x 4.0 x 3.3 cm, containing fluid and air. Surrounding soft tissue inflammation is noted extending minimally along the retroperitoneum. The appendix is difficult to fully assess as it tracks adjacent to the abscess, though the normal appearance of the base of the appendix suggests against appendicitis. The colon is otherwise unremarkable in appearance, aside from mild diverticulosis along the sigmoid colon. The bladder is mildly distended and grossly unremarkable. The prostate remains normal in size. No inguinal lymphadenopathy is seen. No acute osseous abnormalities are identified. IMPRESSION: 1. Acute diverticulitis along the mid sigmoid colon, with diffuse surrounding soft tissue inflammation and trace fluid. Apparent focal perforation extending superiorly from the mid sigmoid colon, with formation of an abscess at the upper pelvis, measuring approximately 5.3 x 4.0 x 3.3 cm, containing fluid and air. Surrounding soft tissue inflammation extends minimally along the retroperitoneum. 2. The appendix is difficult to fully assess, as it tracks adjacent to the abscess, though the normal appearance of the base of the appendix and the tract from the mid sigmoid colon suggests against appendicitis. These results were called by telephone at the time of interpretation on 03/20/2015 at 7:06 pm to Dr. Daleen Bo, who verbally acknowledged these results. Electronically Signed   By: Garald Balding M.D.   On: 03/20/2015 19:08    Medications / Allergies:  Scheduled Meds: . antiseptic oral rinse  7 mL Mouth Rinse q12n4p  . azelastine  1 spray Each Nare BID  . chlorhexidine  15 mL Mouth Rinse BID  . enoxaparin (LOVENOX) injection  40 mg Subcutaneous Q24H  . fluticasone  1 spray Each Nare Daily  . pantoprazole (PROTONIX) IV  40 mg Intravenous QHS  .  piperacillin-tazobactam (ZOSYN)  IV  3.375 g Intravenous 3 times per day   Continuous Infusions: . dextrose 5 % and 0.9% NaCl     PRN Meds:.acetaminophen **OR** acetaminophen, diphenhydrAMINE **OR** diphenhydrAMINE, HYDROmorphone (DILAUDID) injection, ondansetron **OR** ondansetron (ZOFRAN) IV, simethicone, zolpidem  Antibiotics: Anti-infectives    Start     Dose/Rate Route Frequency Ordered Stop   03/20/15 2045  piperacillin-tazobactam (ZOSYN) IVPB 3.375 g     3.375 g 12.5 mL/hr over 240 Minutes Intravenous 3 times per day 03/20/15 2037          Assessment/Plan Sigmoid diverticulitis with probable perforation and abscess formation -ask IR if it is amendable to drainage. Dr. Earleen Newport reviewed and thinks this area is a phlegmon.  Recommends repeating CT scan in 48h with contrast.  -CBC in AM -continue with non operative management.  Hopefully can avoid a hartman's. -will need a colonoscopy 6-8 weeks ID-Zosyn D#1 FEN-NPO, IVF, hyperkalemia-DC K from fluids.  AM labs Dispo-inpatient     Erby Pian, [facility] Surgery Pager [phone](7A-4:30P) For consults and floor pages call [phone](7A-4:30P)  03/21/2015 8:28 AM

## 2015-03-22 LAB — CBC
HCT: 42.9 % (ref 39.0–52.0)
HEMOGLOBIN: 13.8 g/dL (ref 13.0–17.0)
MCH: 28.3 pg (ref 26.0–34.0)
MCHC: 32.2 g/dL (ref 30.0–36.0)
MCV: 87.9 fL (ref 78.0–100.0)
PLATELETS: 298 10*3/uL (ref 150–400)
RBC: 4.88 MIL/uL (ref 4.22–5.81)
RDW: 13.3 % (ref 11.5–15.5)
WBC: 9.7 10*3/uL (ref 4.0–10.5)

## 2015-03-22 LAB — BASIC METABOLIC PANEL
ANION GAP: 6 (ref 5–15)
BUN: 9 mg/dL (ref 6–20)
CALCIUM: 9.6 mg/dL (ref 8.9–10.3)
CHLORIDE: 102 mmol/L (ref 101–111)
CO2: 29 mmol/L (ref 22–32)
Creatinine, Ser: 1.31 mg/dL — ABNORMAL HIGH (ref 0.61–1.24)
GFR calc non Af Amer: >60 mL/min (ref >60)
Glucose, Bld: 105 mg/dL — ABNORMAL HIGH (ref 65–99)
Potassium: 4.5 mmol/L (ref 3.5–5.1)
SODIUM: 137 mmol/L (ref 135–145)

## 2015-03-22 NOTE — Progress Notes (Signed)
Patient ID: Donald Crawford, male   DOB: 1975/04/24, 39 y.o.   MRN: [id number]     [facility]      [address] [place], [place] [address]    Phone: [phone] FAX: [phone]     Subjective: Denies pain.  Passing flatus, no more pain with this as well. Afebrile.  Labs not done yet.   Objective:  Vital signs:  Filed Vitals:   03/21/15 1500 03/21/15 1737 03/21/15 2259 03/22/15 0538  BP:  132/84 111/68 120/77  Pulse:  94 85 82  Temp: 98.6 F (37 C) 98.9 F (37.2 C) 98.2 F (36.8 C) 98.2 F (36.8 C)  TempSrc: Oral Oral Oral Oral  Resp:  _0 Height:      Weight:      SpO2:  99% 98% 99%    Last BM Date: 03/20/15  Intake/Output   Yesterday:  12/05 0701 - 12/06 0700 In: 500 [I.V.:500] Out: 2751 [Urine:1725] This shift:    I/O last 3 completed shifts: In: 1445.8 [I.V.:1445.8] Out: 3025 [Urine:3025]    Physical Exam: General: Pt awake/alert/oriented x4 in no acute distress Chest: cta. No chest wall pain w good excursion CV: Pulses intact. Regular rhythm Abdomen: Soft. Nondistended. non tender. No evidence of peritonitis. No incarcerated hernias. Ext: SCDs BLE. No mjr edema. No cyanosis Skin: No petechiae / purpura   Problem List:   Active Problems:   Diverticulitis of large intestine with perforation and abscess    Results:   Labs: Results for orders placed or performed during the hospital encounter of 03/20/15 (from the past 48 hour(s))  Urinalysis, Routine w reflex microscopic (not at [facility])     Status: Abnormal   Collection Time: 03/20/15  2:23 PM  Result Value Ref Range   Color, Urine YELLOW YELLOW   APPearance CLEAR CLEAR   Specific Gravity, Urine 1.017 1.005 - 1.030   pH 7.5 5.0 - 8.0   Glucose, UA NEGATIVE NEGATIVE mg/dL   Hgb urine dipstick MODERATE (A) NEGATIVE   Bilirubin Urine NEGATIVE NEGATIVE   Ketones, ur NEGATIVE NEGATIVE mg/dL   Protein, ur NEGATIVE NEGATIVE mg/dL    Nitrite NEGATIVE NEGATIVE   Leukocytes, UA NEGATIVE NEGATIVE  Urine microscopic-add on     Status: Abnormal   Collection Time: 03/20/15  2:23 PM  Result Value Ref Range   Squamous Epithelial / LPF 0-5 (A) NONE SEEN   WBC, UA 0-5 0 - 5 WBC/hpf   RBC / HPF 6-30 0 - 5 RBC/hpf   Bacteria, UA NONE SEEN NONE SEEN  Lipase, blood     Status: None   Collection Time: 03/20/15  2:29 PM  Result Value Ref Range   Lipase 26 11 - 51 U/L  Comprehensive metabolic panel     Status: Abnormal   Collection Time: 03/20/15  2:29 PM  Result Value Ref Range   Sodium 136 135 - 145 mmol/L   Potassium 3.7 3.5 - 5.1 mmol/L   Chloride 99 (L) 101 - 111 mmol/L   CO2 27 22 - 32 mmol/L   Glucose, Bld 100 (H) 65 - 99 mg/dL   BUN 12 6 - 20 mg/dL   Creatinine, Ser 1.20 0.61 - 1.24 mg/dL   Calcium 9.1 8.9 - 10.3 mg/dL   Total Protein 7.8 6.5 - 8.1 g/dL   Albumin 3.2 (L) 3.5 - 5.0 g/dL   AST 24 15 - 41 U/L   ALT 37 17 - 63 U/L  Alkaline Phosphatase 102 38 - 126 U/L   Total Bilirubin 0.4 0.3 - 1.2 mg/dL   GFR calc non Af Amer >60 >60 mL/min   GFR calc Af Amer >60 >60 mL/min    Comment: (NOTE) The eGFR has been calculated using the CKD EPI equation. This calculation has not been validated in all clinical situations. eGFR's persistently <60 mL/min signify possible Chronic Kidney Disease.    Anion gap 10 5 - 15  CBC     Status: Abnormal   Collection Time: 03/20/15  2:29 PM  Result Value Ref Range   WBC 17.1 (H) 4.0 - 10.5 K/uL   RBC 4.84 4.22 - 5.81 MIL/uL   Hemoglobin 13.8 13.0 - 17.0 g/dL   HCT 41.7 39.0 - 52.0 %   MCV 86.2 78.0 - 100.0 fL   MCH 28.5 26.0 - 34.0 pg   MCHC 33.1 30.0 - 36.0 g/dL   RDW 13.1 11.5 - 15.5 %   Platelets 281 150 - 400 K/uL  CBC     Status: Abnormal   Collection Time: 03/21/15  4:01 AM  Result Value Ref Range   WBC 17.3 (H) 4.0 - 10.5 K/uL   RBC 4.54 4.22 - 5.81 MIL/uL   Hemoglobin 12.9 (L) 13.0 - 17.0 g/dL   HCT 39.2 39.0 - 52.0 %   MCV 86.3 78.0 - 100.0 fL   MCH 28.4  26.0 - 34.0 pg   MCHC 32.9 30.0 - 36.0 g/dL   RDW 13.3 11.5 - 15.5 %   Platelets 273 150 - 400 K/uL  Basic metabolic panel     Status: Abnormal   Collection Time: 03/21/15  4:01 AM  Result Value Ref Range   Sodium 136 135 - 145 mmol/L   Potassium 5.2 (H) 3.5 - 5.1 mmol/L    Comment: SLIGHT HEMOLYSIS   Chloride 101 101 - 111 mmol/L   CO2 29 22 - 32 mmol/L   Glucose, Bld 109 (H) 65 - 99 mg/dL   BUN 11 6 - 20 mg/dL   Creatinine, Ser 1.36 (H) 0.61 - 1.24 mg/dL   Calcium 9.0 8.9 - 10.3 mg/dL   GFR calc non Af Amer >60 >60 mL/min   GFR calc Af Amer >60 >60 mL/min    Comment: (NOTE) The eGFR has been calculated using the CKD EPI equation. This calculation has not been validated in all clinical situations. eGFR's persistently <60 mL/min signify possible Chronic Kidney Disease.    Anion gap 6 5 - 15  APTT     Status: None   Collection Time: 03/21/15  9:08 AM  Result Value Ref Range   aPTT 32 24 - 37 seconds  Protime-INR     Status: None   Collection Time: 03/21/15  9:08 AM  Result Value Ref Range   Prothrombin Time 14.4 11.6 - 15.2 seconds   INR 1.10 0.00 - 1.49    Imaging / Studies: Ct Renal Stone Study  03/20/2015  CLINICAL DATA:  Acute onset of right lower quadrant abdominal pain and hematuria. Initial encounter. EXAM: CT ABDOMEN AND PELVIS WITHOUT CONTRAST TECHNIQUE: Multidetector CT imaging of the abdomen and pelvis was performed following the standard protocol without IV contrast. COMPARISON:  None. FINDINGS: The visualized lung bases are clear. The liver and spleen are unremarkable in appearance. The gallbladder is within normal limits. The pancreas and adrenal glands are unremarkable. The kidneys are unremarkable in appearance. There is no evidence of hydronephrosis. No renal or ureteral stones are seen. No perinephric stranding  is appreciated. No free fluid is identified. The small bowel is unremarkable in appearance. The stomach is within normal limits. No acute vascular  abnormalities are seen. Acute diverticulitis is noted along the mid sigmoid colon, with mild wall thickening, diffuse surrounding soft tissue inflammation and trace fluid. There appears to be focal perforation extending superiorly from the mid sigmoid colon, with resultant formation of an abscess at the upper pelvis, measuring approximately 5.3 x 4.0 x 3.3 cm, containing fluid and air. Surrounding soft tissue inflammation is noted extending minimally along the retroperitoneum. The appendix is difficult to fully assess as it tracks adjacent to the abscess, though the normal appearance of the base of the appendix suggests against appendicitis. The colon is otherwise unremarkable in appearance, aside from mild diverticulosis along the sigmoid colon. The bladder is mildly distended and grossly unremarkable. The prostate remains normal in size. No inguinal lymphadenopathy is seen. No acute osseous abnormalities are identified. IMPRESSION: 1. Acute diverticulitis along the mid sigmoid colon, with diffuse surrounding soft tissue inflammation and trace fluid. Apparent focal perforation extending superiorly from the mid sigmoid colon, with formation of an abscess at the upper pelvis, measuring approximately 5.3 x 4.0 x 3.3 cm, containing fluid and air. Surrounding soft tissue inflammation extends minimally along the retroperitoneum. 2. The appendix is difficult to fully assess, as it tracks adjacent to the abscess, though the normal appearance of the base of the appendix and the tract from the mid sigmoid colon suggests against appendicitis. These results were called by telephone at the time of interpretation on 03/20/2015 at 7:06 pm to Dr. Daleen Bo, who verbally acknowledged these results. Electronically Signed   By: Garald Balding M.D.   On: 03/20/2015 19:08    Medications / Allergies:  Scheduled Meds: . antiseptic oral rinse  7 mL Mouth Rinse q12n4p  . azelastine  1 spray Each Nare BID  . chlorhexidine  15 mL  Mouth Rinse BID  . enoxaparin (LOVENOX) injection  40 mg Subcutaneous Q24H  . fluticasone  1 spray Each Nare Daily  . pantoprazole (PROTONIX) IV  40 mg Intravenous QHS  . piperacillin-tazobactam (ZOSYN)  IV  3.375 g Intravenous 3 times per day   Continuous Infusions: . dextrose 5 % and 0.9% NaCl 75 mL/hr at 03/21/15 1012   PRN Meds:.acetaminophen **OR** acetaminophen, diphenhydrAMINE **OR** diphenhydrAMINE, HYDROmorphone (DILAUDID) injection, ondansetron **OR** ondansetron (ZOFRAN) IV, simethicone, zolpidem  Antibiotics: Anti-infectives    Start     Dose/Rate Route Frequency Ordered Stop   03/20/15 2045  piperacillin-tazobactam (ZOSYN) IVPB 3.375 g     3.375 g 12.5 mL/hr over 240 Minutes Intravenous 3 times per day 03/20/15 2037         Assessment/Plan Sigmoid diverticulitis with microperforation and phlegmon -improved clinically await CBC -CBC in AM -continue with non operative management. Hopefully can avoid a hartman's. -will need a colonoscopy 6-8 weeks ID-Zosyn D#2 FEN-allow for clear liquids, IVF.  Hyperkalemia-await BMP Dispo-inpatient   Erby Pian, [facility] Surgery Pager [phone]) For consults and floor pages call [phone](7A-4:30P)  03/22/2015 7:45 AM

## 2015-03-23 LAB — CBC
HEMATOCRIT: 40.4 % (ref 39.0–52.0)
Hemoglobin: 13 g/dL (ref 13.0–17.0)
MCH: 27.8 pg (ref 26.0–34.0)
MCHC: 32.2 g/dL (ref 30.0–36.0)
MCV: 86.5 fL (ref 78.0–100.0)
Platelets: 311 10*3/uL (ref 150–400)
RBC: 4.67 MIL/uL (ref 4.22–5.81)
RDW: 13.1 % (ref 11.5–15.5)
WBC: 10.2 10*3/uL (ref 4.0–10.5)

## 2015-03-23 MED ORDER — AMOXICILLIN-POT CLAVULANATE 875-125 MG PO TABS: 1.0000 | ORAL_TABLET | Freq: Two times a day (BID) | ORAL | Status: DC

## 2015-03-23 NOTE — Care Management Note (Signed)
Case Management Note  Patient Details  Name: Donald Crawford MRN: [id number] Date of Birth: [birth year]  Subjective/Objective:                    Action/Plan:   Expected Discharge Date:  03/25/15               Expected Discharge Plan:  Home/Self Care  In-House Referral:     Discharge planning Services     Post Acute Care Choice:    Choice offered to:     DME Arranged:    DME Agency:     HH Arranged:    HH Agency:     Status of Service:  Completed, signed off  Medicare Important Message Given:    Date Medicare IM Given:    Medicare IM give by:    Date Additional Medicare IM Given:    Additional Medicare Important Message give by:     If discussed at Long Length of Stay Meetings, dates discussed:    Additional Comments:  [name], Donald Jarrett Marie, Donald Crawford 03/23/2015, 11:22 AM

## 2015-03-23 NOTE — Discharge Instructions (Signed)
Diverticulitis Diverticulitis is inflammation or infection of small pouches in your colon that form when you have a condition called diverticulosis. The pouches in your colon are called diverticula. Your colon, or large intestine, is where water is absorbed and stool is formed. Complications of diverticulitis can include:  Bleeding.  Severe infection.  Severe pain.  Perforation of your colon.  Obstruction of your colon. CAUSES  Diverticulitis is caused by bacteria. Diverticulitis happens when stool becomes trapped in diverticula. This allows bacteria to grow in the diverticula, which can lead to inflammation and infection. RISK FACTORS People with diverticulosis are at risk for diverticulitis. Eating a diet that does not include enough fiber from fruits and vegetables may make diverticulitis more likely to develop. SYMPTOMS  Symptoms of diverticulitis may include:  Abdominal pain and tenderness. The pain is normally located on the left side of the abdomen, but may occur in other areas.  Fever and chills.  Bloating.  Cramping.  Nausea.  Vomiting.  Constipation.  Diarrhea.  Blood in your stool. DIAGNOSIS  Your health care provider will ask you about your medical history and do a physical exam. You may need to have tests done because many medical conditions can cause the same symptoms as diverticulitis. Tests may include:  Blood tests.  Urine tests.  Imaging tests of the abdomen, including X-rays and CT scans. When your condition is under control, your health care provider may recommend that you have a colonoscopy. A colonoscopy can show how severe your diverticula are and whether something else is causing your symptoms. TREATMENT  Most cases of diverticulitis are mild and can be treated at home. Treatment may include:  Taking over-the-counter pain medicines.  Following a clear liquid diet.  Taking antibiotic medicines by mouth for 7-10 days. More severe cases may  be treated at a hospital. Treatment may include:  Not eating or drinking.  Taking prescription pain medicine.  Receiving antibiotic medicines through an IV tube.  Receiving fluids and nutrition through an IV tube.  Surgery. HOME CARE INSTRUCTIONS   Follow your health care provider's instructions carefully.  Follow a full liquid diet or other diet as directed by your health care provider. After your symptoms improve, your health care provider may tell you to change your diet. He or she may recommend you eat a high-fiber diet. Fruits and vegetables are good sources of fiber. Fiber makes it easier to pass stool.  Take fiber supplements or probiotics as directed by your health care provider.  Only take medicines as directed by your health care provider.  Keep all your follow-up appointments. SEEK MEDICAL CARE IF:   Your pain does not improve.  You have a hard time eating food.  Your bowel movements do not return to normal. SEEK IMMEDIATE MEDICAL CARE IF:   Your pain becomes worse.  Your symptoms do not get better.  Your symptoms suddenly get worse.  You have a fever.  You have repeated vomiting.  You have bloody or black, tarry stools. MAKE SURE YOU:   Understand these instructions.  Will watch your condition.  Will get help right away if you are not doing well or get worse.   This information is not intended to replace advice given to you by your health care provider. Make sure you discuss any questions you have with your health care provider.   Document Released: 01/10/2005 Document Revised: 04/07/2013 Document Reviewed: 02/25/2013 Elsevier Interactive Patient Education 2016 Else[employer] Inc[employer]  Low-Fiber Diet Fiber is found in fruits, vegetables,  and whole grains. A low-fiber diet restricts fibrous foods that are not digested in the small intestine. A diet containing about 10-15 grams of fiber per day is considered low fiber. Low-fiber diets may be used  to:  Promote healing and rest the bowel during intestinal flare-ups.  Prevent blockage of a partially obstructed or narrowed gastrointestinal tract.  Reduce fecal weight and volume.  Slow the movement of feces. You may be on a low-fiber diet as a transitional diet following surgery, after an injury (trauma), or because of a short (acute) or lifelong (chronic) illness. Your health care provider will determine the length of time you need to stay on this diet.  WHAT DO I NEED TO KNOW ABOUT A LOW-FIBER DIET? Always check the fiber content on the packaging's Nutrition Facts label, especially on foods from the grains list. Ask your dietitian if you have questions about specific foods that are related to your condition, especially if the food is not listed below. In general, a low-fiber food will have less than 2 g of fiber. WHAT FOODS CAN I EAT? Grains All breads and crackers made with white flour. Sweet rolls, doughnuts, waffles, pancakes, [place] toast, bagels. Pretzels, Melba toast, zwieback. Well-cooked cereals, such as cornmeal, farina, or cream cereals. Dry cereals that do not contain whole grains, fruit, or nuts, such as refined corn, wheat, rice, and oat cereals. Potatoes prepared any way without skins, plain pastas and noodles, refined white rice. Use white flour for baking and making sauces. Use allowed list of grains for casseroles, dumplings, and puddings.  Vegetables Strained tomato and vegetable juices. Fresh lettuce, cucumber, spinach. Well-cooked (no skin or pulp) or canned vegetables, such as asparagus, bean sprouts, beets, carrots, green beans, mushrooms, potatoes, pumpkin, spinach, yellow squash, tomato sauce/puree, turnips, yams, and zucchini. Keep servings limited to  cup.  Fruits All fruit juices except prune juice. Cooked or canned fruits without skin and seeds, such as applesauce, apricots, cherries, fruit cocktail, grapefruit, grapes, mandarin oranges, melons, peaches, pears,  pineapple, and plums. Fresh fruits without skin, such as apricots, avocados, bananas, melons, pineapple, nectarines, and peaches. Keep servings limited to  cup or 1 piece.  Meat and Other Protein Sources Ground or well-cooked tender beef, ham, veal, lamb, pork, or poultry. Eggs, plain cheese. Fish, oysters, shrimp, lobster, and other seafood. Liver, organ meats. Smooth nut butters. Dairy All milk products and alternative dairy substitutes, such as soy, rice, almond, and coconut, not containing added whole nuts, seeds, or added fruit. Beverages Decaf coffee, fruit, and vegetable juices or smoothies (small amounts, with no pulp or skins, and with fruits from allowed list), sports drinks, herbal tea. Condiments Ketchup, mustard, vinegar, cream sauce, cheese sauce, cocoa powder. Spices in moderation, such as allspice, basil, bay leaves, celery powder or leaves, cinnamon, cumin powder, curry powder, ginger, mace, marjoram, onion or garlic powder, oregano, paprika, parsley flakes, ground pepper, rosemary, sage, savory, tarragon, thyme, and turmeric. Sweets and Desserts Plain cakes and cookies, pie made with allowed fruit, pudding, custard, cream pie. Gelatin, fruit, ice, sherbet, frozen ice pops. Ice cream, ice milk without nuts. Plain hard candy, honey, jelly, molasses, syrup, sugar, chocolate syrup, gumdrops, marshmallows. Limit overall sugar intake.  Fats and Oil Margarine, butter, cream, mayonnaise, salad oils, plain salad dressings made from allowed foods. Choose healthy fats such as olive oil, canola oil, and omega-3 fatty acids (such as found in salmon or tuna) when possible.  Other Bouillon, broth, or cream soups made from allowed foods. Any strained soup. Casseroles  or mixed dishes made with allowed foods. The items listed above may not be a complete list of recommended foods or beverages. Contact your dietitian for more options.  WHAT FOODS ARE NOT RECOMMENDED? Grains All whole wheat and  whole grain breads and crackers. Multigrains, rye, bran seeds, nuts, or coconut. Cereals containing whole grains, multigrains, bran, coconut, nuts, raisins. Cooked or dry oatmeal, steel-cut oats. Coarse wheat cereals, granola. Cereals advertised as high fiber. Potato skins. Whole grain pasta, wild or brown rice. Popcorn. Coconut flour. Bran, buckwheat, corn bread, multigrains, rye, wheat germ.  Vegetables Fresh, cooked or canned vegetables, such as artichokes, asparagus, beet greens, broccoli, Brussels sprouts, cabbage, celery, cauliflower, corn, eggplant, kale, legumes or beans, okra, peas, and tomatoes. Avoid large servings of any vegetables, especially raw vegetables.  Fruits Fresh fruits, such as apples with or without skin, berries, cherries, figs, grapes, grapefruit, guavas, kiwis, mangoes, oranges, papayas, pears, persimmons, pineapple, and pomegranate. Prune juice and juices with pulp, stewed or dried prunes. Dried fruits, dates, raisins. Fruit seeds or skins. Avoid large servings of all fresh fruits. Meats and Other Protein Sources Tough, fibrous meats with gristle. Chunky nut butter. Cheese made with seeds, nuts, or other foods not recommended. Nuts, seeds, legumes (beans, including baked beans), dried peas, beans, lentils.  Dairy Yogurt or cheese that contains nuts, seeds, or added fruit.  Beverages Fruit juices with high pulp, prune juice. Caffeinated coffee and teas.  Condiments Coconut, maple syrup, pickles, olives. Sweets and Desserts Desserts, cookies, or candies that contain nuts or coconut, chunky peanut butter, dried fruits. Jams, preserves with seeds, marmalade. Large amounts of sugar and sweets. Any other dessert made with fruits from the not recommended list.  Other Soups made from vegetables that are not recommended or that contain other foods not recommended.  The items listed above may not be a complete list of foods and beverages to avoid. Contact your dietitian for more  information.   This information is not intended to replace advice given to you by your health care provider. Make sure you discuss any questions you have with your health care provider.   Document Released: 09/22/2001 Document Revised: 04/07/2013 Document Reviewed: 02/23/2013 Elsevier Interactive Patient Education 2016 Elsevier Inc.  High-Fiber Diet Fiber, also called dietary fiber, is a type of carbohydrate found in fruits, vegetables, whole grains, and beans. A high-fiber diet can have many health benefits. Your health care provider may recommend a high-fiber diet to help:  Prevent constipation. Fiber can make your bowel movements more regular.  Lower your cholesterol.  Relieve hemorrhoids, uncomplicated diverticulosis, or irritable bowel syndrome.  Prevent overeating as part of a weight-loss plan.  Prevent heart disease, type 2 diabetes, and certain cancers. WHAT IS MY PLAN? The recommended daily intake of fiber includes:  38 grams for men under age 55.  30 grams for men over age 41.  25 grams for women under age 64.  21 grams for women over age 78. You can get the recommended daily intake of dietary fiber by eating a variety of fruits, vegetables, grains, and beans. Your health care provider may also recommend a fiber supplement if it is not possible to get enough fiber through your diet. WHAT DO I NEED TO KNOW ABOUT A HIGH-FIBER DIET?  Fiber supplements have not been widely studied for their effectiveness, so it is better to get fiber through food sources.  Always check the fiber content on thenutrition facts label of any prepackaged food. Look for foods that contain at least  5 grams of fiber per serving.  Ask your dietitian if you have questions about specific foods that are related to your condition, especially if those foods are not listed in the following section.  Increase your daily fiber consumption gradually. Increasing your intake of dietary fiber too quickly may  cause bloating, cramping, or gas.  Drink plenty of water. Water helps you to digest fiber. WHAT FOODS CAN I EAT? Grains Whole-grain breads. Multigrain cereal. Oats and oatmeal. Brown rice. Barley. Bulgur wheat. Millet. Bran muffins. Popcorn. Rye wafer crackers. Vegetables Sweet potatoes. Spinach. Kale. Artichokes. Cabbage. Broccoli. Green peas. Carrots. Squash. Fruits Berries. Pears. Apples. Oranges. Avocados. Prunes and raisins. Dried figs. Meats and Other Protein Sources Navy, kidney, pinto, and soy beans. Split peas. Lentils. Nuts and seeds. Dairy Fiber-fortified yogurt. Beverages Fiber-fortified soy milk. Fiber-fortified orange juice. Other Fiber bars. The items listed above may not be a complete list of recommended foods or beverages. Contact your dietitian for more options. WHAT FOODS ARE NOT RECOMMENDED? Grains White bread. Pasta made with refined flour. White rice. Vegetables Fried potatoes. Canned vegetables. Well-cooked vegetables.  Fruits Fruit juice. Cooked, strained fruit. Meats and Other Protein Sources Fatty cuts of meat. Fried Environmental education officer or fried fish. Dairy Milk. Yogurt. Cream cheese. Sour cream. Beverages Soft drinks. Other Cakes and pastries. Butter and oils. The items listed above may not be a complete list of foods and beverages to avoid. Contact your dietitian for more information. WHAT ARE SOME TIPS FOR INCLUDING HIGH-FIBER FOODS IN MY DIET?  Eat a wide variety of high-fiber foods.  Make sure that half of all grains consumed each day are whole grains.  Replace breads and cereals made from refined flour or white flour with whole-grain breads and cereals.  Replace white rice with brown rice, bulgur wheat, or millet.  Start the day with a breakfast that is high in fiber, such as a cereal that contains at least 5 grams of fiber per serving.  Use beans in place of meat in soups, salads, or pasta.  Eat high-fiber snacks, such as berries, raw vegetables,  nuts, or popcorn.   This information is not intended to replace advice given to you by your health care provider. Make sure you discuss any questions you have with your health care provider.   Document Released: 04/02/2005 Document Revised: 04/23/2014 Document Reviewed: 09/15/2013 Elsevier Interactive Patient Education [employer]

## 2015-03-23 NOTE — Discharge Summary (Signed)
Patient ID: Donald SaltsDavid Crawford MRN: [id number] DOB/AGE: Jul 19, 1975 39 y.o.  Admit date: 03/20/2015 Discharge date: 03/23/2015  Procedures: none  Consults: None  Reason for Admission:  This is a 39 yo male in good health who began having some low suprapubic/ pelvic tenderness about 9 days ago. This was associated with some burning when his bladder was full, but slightly relieved with urination. He was examined by his PCP five days ago, and was diagnosed with UTI and started on Bactrim DS. He felt slightly better. He has been constipated for the last couple of days, so he used a suppository and drank a bottle of Mg Citrate yesterday evening. He had good results, but subsequently developed severe pain in his lower pelvic/ suprapubic region. He has not had fever. Poor appetite yesterday, has had a little bit to eat today. No melena or hematochezia.  Admission Diagnoses:  1. Acute diverticulitis with microperforation and abscess  Hospital Course: the patient was admitted and started on Zosyn.  IR evaluated the patient and felt as if this abscess was more phlegmonous and unable to be drained.  The patient began to gradually improve with bowel rest and IV abx therapy.  His WBC decreased with time and normalized.  His diet was able to be advanced as tolerates after a couple of days of bowel rest.  His pain has resolved by day of discharge.  He was otherwise felt stable for dc home to complete another week's worth of abx therapy.  PE: Abd: soft, NT, ND, +BS Heart: regular Lungs: CTAB  Discharge Diagnoses:  Active Problems:   Diverticulitis of large intestine with perforation and abscess   Discharge Medications:   Medication List    STOP taking these medications        sulfamethoxazole-trimethoprim 800-160 MG tablet  Commonly known as:  BACTRIM DS,SEPTRA DS      TAKE these medications        acetaminophen 325 MG tablet  Commonly known as:  TYLENOL  Take 650 mg by mouth every 6 (six)  hours as needed for mild pain.     amoxicillin-clavulanate 875-125 MG tablet  Commonly known as:  AUGMENTIN  Take 1 tablet by mouth 2 (two) times daily.     azelastine 0.1 % nasal spray  Commonly known as:  ASTELIN  Place 1 spray into both nostrils 2 (two) times daily. Use in each nostril as directed     fluticasone 50 MCG/ACT nasal spray  Commonly known as:  FLONASE  Place 1 spray into both nostrils daily.     ibuprofen 400 MG tablet  Commonly known as:  ADVIL,MOTRIN  Take 400 mg by mouth every 6 (six) hours as needed for mild pain.        Discharge Instructions:     Follow-up Information    Follow up with Jimmye NormanJAMES WYATT, MD In 3 weeks.   Specialty:  General Surgery   Why:  our office will call you with an appointment time   Contact information:   [address] [place] [address] [phone]       Signed: Letha CapeOSBORNE,Montasia Chisenhall E 03/23/2015, 9:50 AM

## 2015-03-23 NOTE — Plan of Care (Signed)
Problem: Food- and Nutrition-Related Knowledge Deficit (NB-1.1) Goal: Nutrition education Formal process to instruct or train a patient/client in a skill or to impart knowledge to help patients/clients voluntarily manage or modify food choices and eating behavior to maintain or improve health. Outcome: Adequate for Discharge Nutrition Education Note  RD consulted for nutrition education regarding diverticulitis.  RD provided "Fiber Restricted Nutrition Therapy" handout from the Academy of [employer]etics. Reviewed patient's dietary recall. Provided examples on ways to decrease fiber intake in diet. Discouraged fresh fruits and vegetables as well as whole grain sources of carbohydrates to minimize fiber intake. Discussed specific examples of low fiber alternatives to commonly eaten high fiber foods   RD discussed why it is important for patient to adhere to diet recommendations. Also discussed transition to slowly add back high fiber foods in diet in about 2-3 weeks to prevent future flare-ups. Teach back method used.  Expect good compliance.  Body mass index is 27.96 kg/(m^2). Pt meets criteria for overweight based on current BMI.  Current diet order is soft, patient is consuming approximately n/a% of meals at this time. Labs and medications reviewed. No further nutrition interventions warranted at this time. RD contact information provided. If additional nutrition issues arise, please re-consult RD.   Donald Crawford, RD, LDN, CDE Pager: [phone] After hours Pager: [phone]

## 2015-03-23 NOTE — Progress Notes (Signed)
Discussed discharge summary with patient. Reviewed all medications with patient. Patient received work note. Patient ready for discharge. 

## 2015-03-25 ENCOUNTER — Other Ambulatory Visit: Payer: Self-pay | Admitting: Surgery

## 2015-03-25 DIAGNOSIS — K5732 Diverticulitis of large intestine without perforation or abscess without bleeding: Secondary | ICD-10-CM

## 2015-04-04 ENCOUNTER — Ambulatory Visit
Admission: RE | Admit: 2015-04-04 | Discharge: 2015-04-04 | Disposition: A | Discharge status: Home / Self Care | Payer: Managed Care, Other (non HMO) | Source: Ambulatory Visit | Attending: Surgery | Admitting: Surgery

## 2015-04-04 DIAGNOSIS — K5732 Diverticulitis of large intestine without perforation or abscess without bleeding: Secondary | ICD-10-CM

## 2015-04-04 MED ORDER — IOPAMIDOL (ISOVUE-300) INJECTION 61%
100.0000 mL | Freq: Once | INTRAVENOUS | Status: AC | PRN
  Administered 2015-04-04: 100 mL via INTRAVENOUS

## 2017-06-23 IMAGING — CT CT RENAL STONE PROTOCOL
2 of 4 series · 16 of 46 positions shown, 18 images · non-contrast
Comparison: None.

CLINICAL DATA: Acute onset of right lower quadrant abdominal pain
and hematuria. Initial encounter.

EXAM:
CT ABDOMEN AND PELVIS WITHOUT CONTRAST
TECHNIQUE: Multidetector CT imaging of the abdomen and pelvis was performed
following the standard protocol without IV contrast.

[Series 2: stone study 5.0 i30f 1 · axial · 0.77mm/px · z∈[+824,+1264]mm · 13 of 98 slices shown, 15 images]
[im 5/98  soft-tissue]
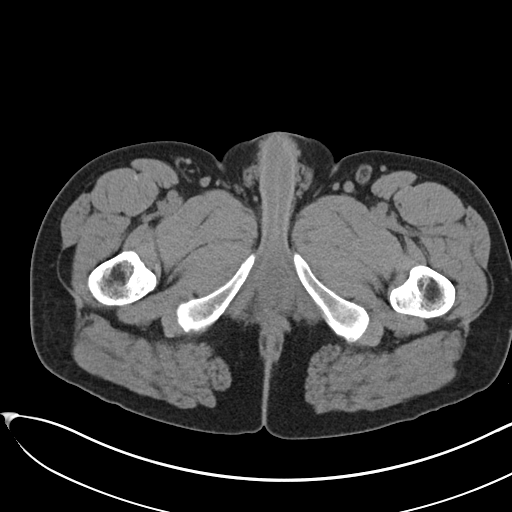
[im 5/98  bone]
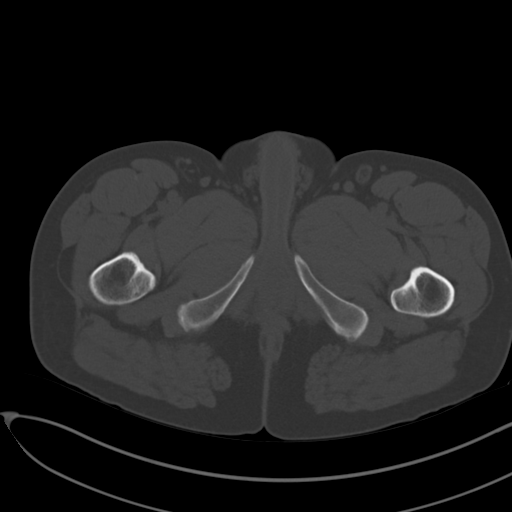
[im 14/98  soft-tissue]
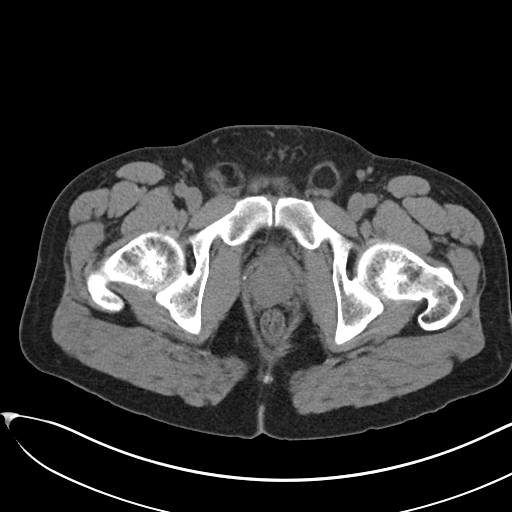
[im 23/98  soft-tissue]
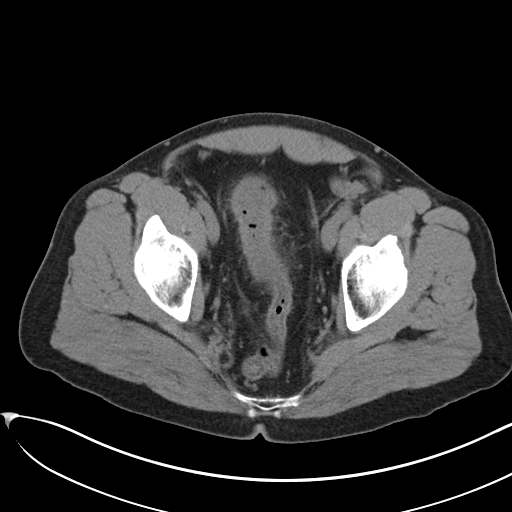
[im 27/98  soft-tissue]
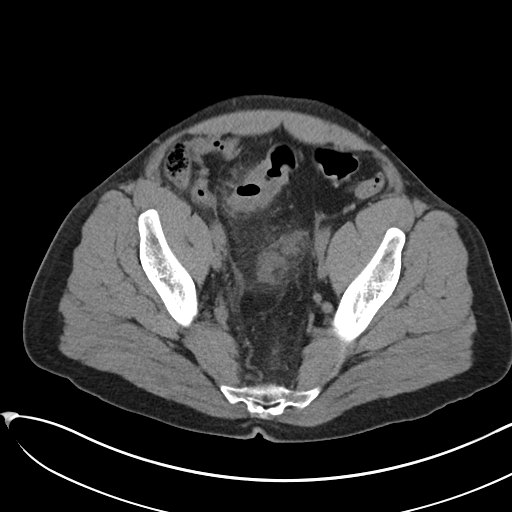
[im 36/98  soft-tissue]
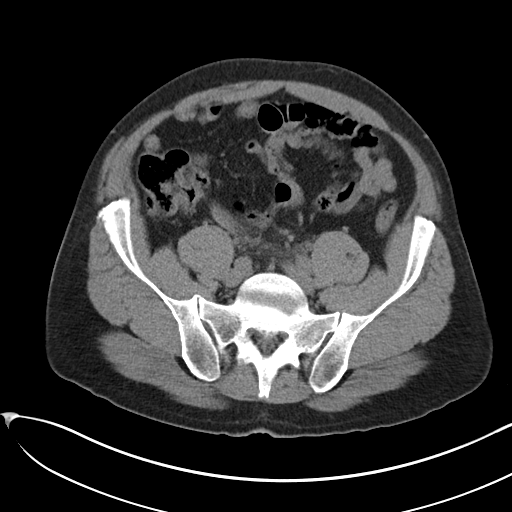
[im 40/98  soft-tissue]
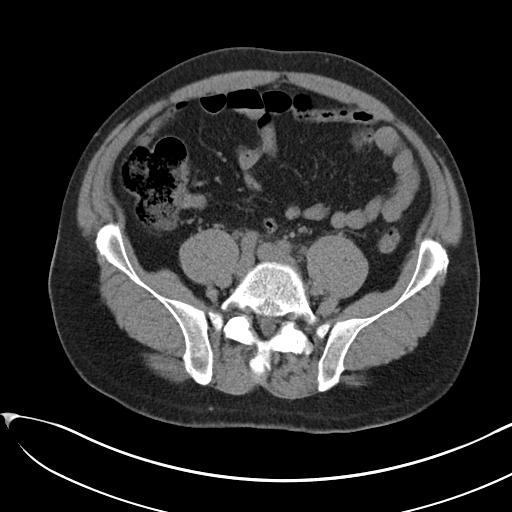
[im 49/98  soft-tissue]
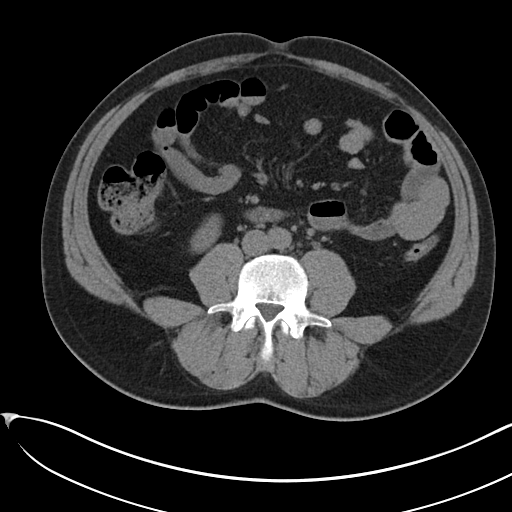
[im 58/98  soft-tissue]
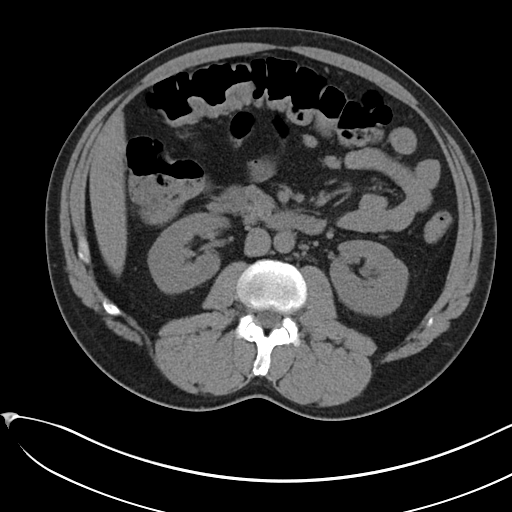
[im 62/98  soft-tissue]
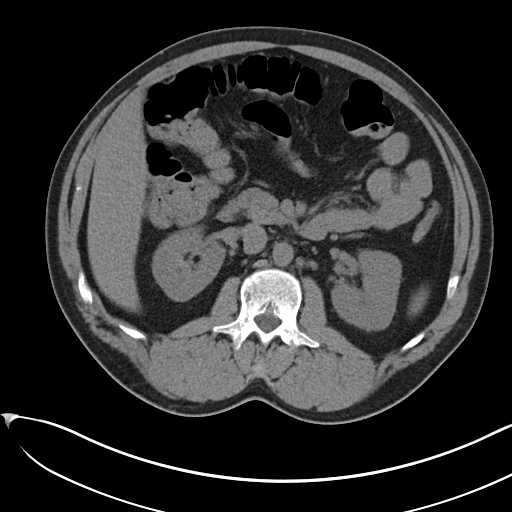
[im 62/98  bone]
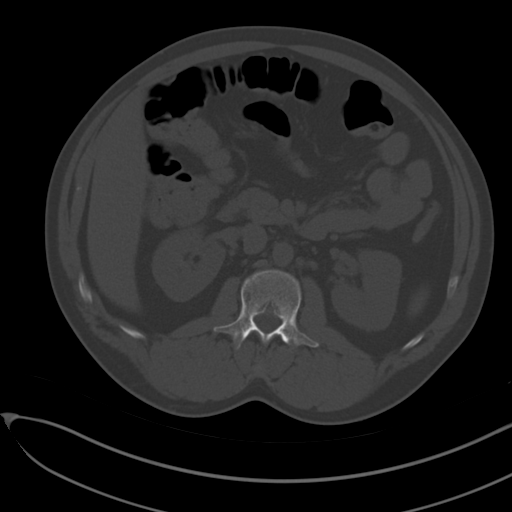
[im 71/98  soft-tissue]
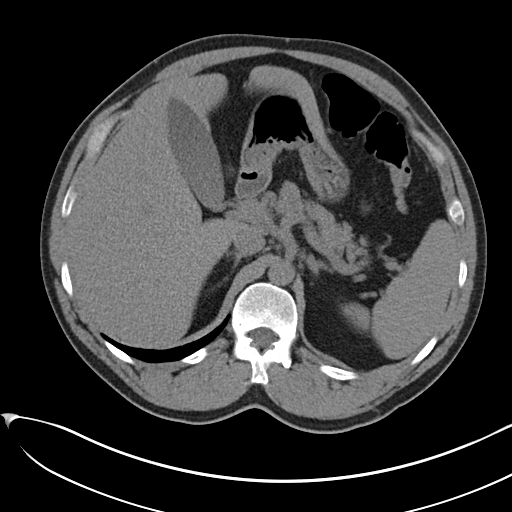
[im 75/98  soft-tissue]
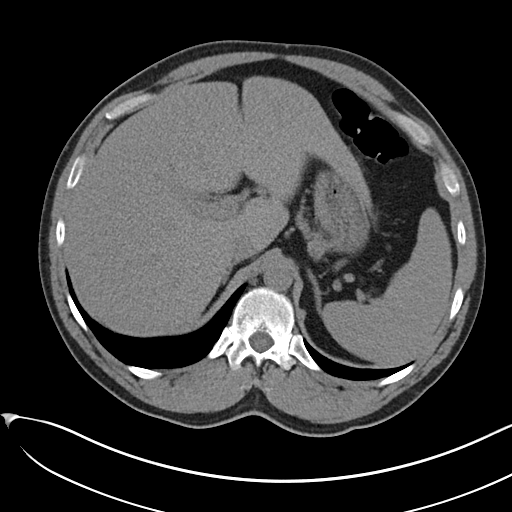
[im 84/98  soft-tissue]
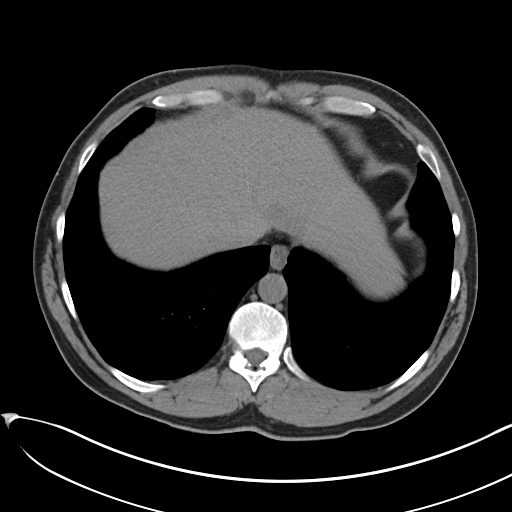
[im 93/98  soft-tissue]
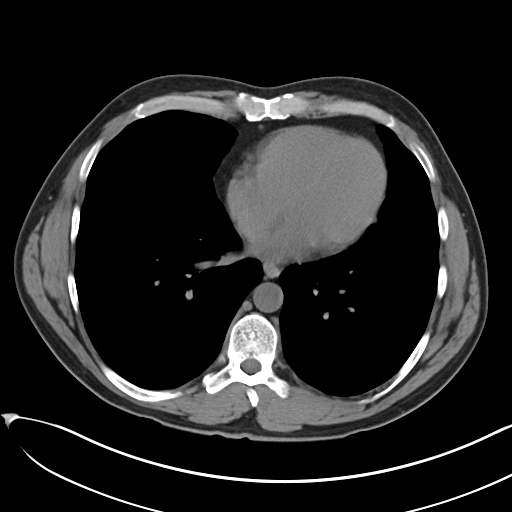

[Series 5: coronal soft tissue · coronal · 0.80mm/px · 3 of 99 slices shown]
[im 33/99  soft-tissue]
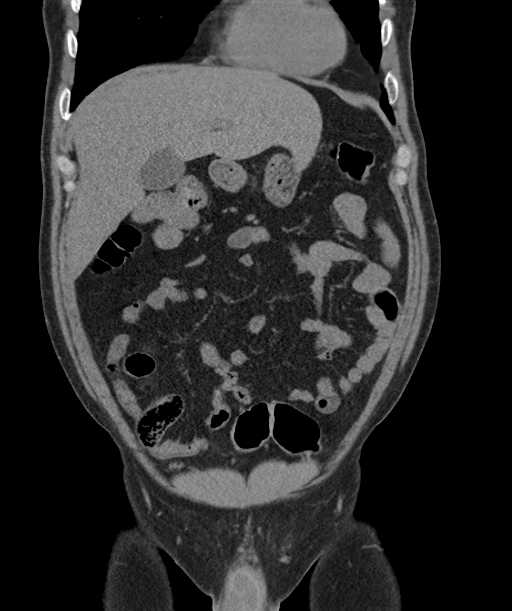
[im 44/99  soft-tissue]
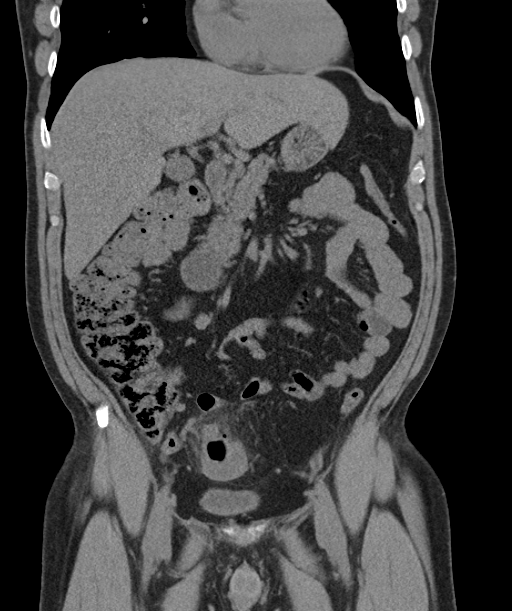
[im 55/99  soft-tissue]
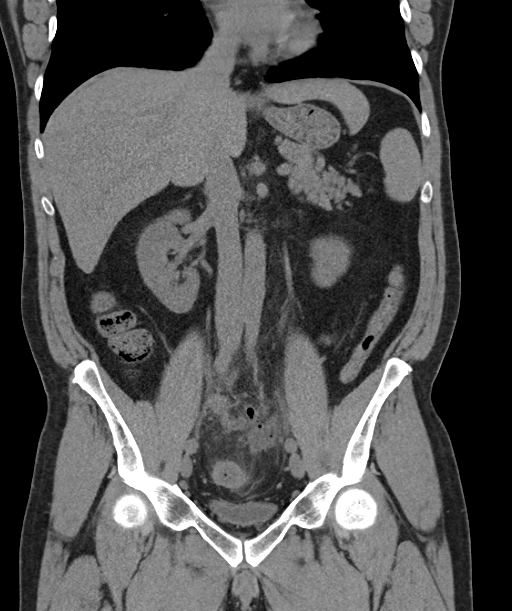

[16 of 46 positions shown; findings below may reference images not displayed]

FINDINGS: The visualized lung bases are clear.

The liver and spleen are unremarkable in appearance. The gallbladder
is within normal limits. The pancreas and adrenal glands are
unremarkable.

The kidneys are unremarkable in appearance. There is no evidence of
hydronephrosis. No renal or ureteral stones are seen. No perinephric
stranding is appreciated.

No free fluid is identified. The small bowel is unremarkable in
appearance. The stomach is within normal limits. No acute vascular
abnormalities are seen.

Acute diverticulitis is noted along the mid sigmoid colon, with mild
wall thickening, diffuse surrounding soft tissue inflammation and
trace fluid. There appears to be focal perforation extending
superiorly from the mid sigmoid colon, with resultant formation of
an abscess at the upper pelvis, measuring approximately 5.3 x 4.0 x
3.3 cm, containing fluid and air. Surrounding soft tissue
inflammation is noted extending minimally along the retroperitoneum.

The appendix is difficult to fully assess as it tracks adjacent to
the abscess, though the normal appearance of the base of the
appendix suggests against appendicitis. The colon is otherwise
unremarkable in appearance, aside from mild diverticulosis along the
sigmoid colon.

The bladder is mildly distended and grossly unremarkable. The
prostate remains normal in size. No inguinal lymphadenopathy is
seen.

No acute osseous abnormalities are identified.
IMPRESSION: 1. Acute diverticulitis along the mid sigmoid colon, with diffuse
surrounding soft tissue inflammation and trace fluid. Apparent focal
perforation extending superiorly from the mid sigmoid colon, with
formation of an abscess at the upper pelvis, measuring approximately
5.3 x 4.0 x 3.3 cm, containing fluid and air. Surrounding soft
tissue inflammation extends minimally along the retroperitoneum.
2. The appendix is difficult to fully assess, as it tracks adjacent
to the abscess, though the normal appearance of the base of the
appendix and the tract from the mid sigmoid colon suggests against
appendicitis.

These results were called by telephone at the time of interpretation
on 03/20/2015 at [DATE] to Dr. CLAUDYO KNABBEN, who verbally
acknowledged these results.

## 2018-02-17 ENCOUNTER — Ambulatory Visit: Payer: Managed Care, Other (non HMO) | Admitting: Adult Health

## 2018-03-03 NOTE — Progress Notes (Signed)
New Patient Office Visit  Subjective:  Patient ID: Donald Crawford, male    DOB: 1975-11-18  Age: 42 y.o. MRN: [id number]  HPI Donald Crawford is here to establish as a new pt.  He is a pleasant 42 year old male. PMH: He denies chronic medical conditions/daily prescriptions medications. He was briefly hospitalized Dec 2016 with sigmoid diverticulitis with microperforation and abscess that responded well to IV Zosyn. He never followed-up with GI as instructed. He has not had complete physical in years. He denies current tobacco/vape use and reports only social ETOH use He stopped regular exercise Spring 2019 due to L shoulder pain. He estimates to only drink 8 oz water/day, he prefers to hydrate with 3-5 Dr. Pepper's/Diet Dr. Pat Crawford.   He also reports consuming >20 oz coffee/day He seldom eats breakfast and reports frequent fast food intake for lunch/dinner. He reports excellent sleep  He denies acute complaints today His wife (RN at cardiac step down unit at [facility]) is at [facility] during OV  Past Medical History:  Diagnosis Date  . Diverticulitis   . Seasonal allergies     History reviewed. No pertinent surgical history.  History reviewed. No pertinent family history.  Social History   Socioeconomic History  . Marital status: Married    Spouse name: Not on file  . Number of children: Not on file  . Years of education: Not on file  . Highest education level: Not on file  Occupational History  . Not on file  Social Needs  . Financial resource strain: Not on file  . Food insecurity:    Worry: Not on file    Inability: Not on file  . Transportation needs:    Medical: Not on file    Non-medical: Not on file  Tobacco Use  . Smoking status: Never Smoker  . Smokeless tobacco: Never Used  Substance and Sexual Activity  . Alcohol use: Yes    Comment: occassionally  . Drug use: No  . Sexual activity: Yes    Birth control/protection: None  Lifestyle  . Physical activity:   Days per week: Not on file    Minutes per session: Not on file  . Stress: Not on file  Relationships  . Social connections:    Talks on phone: Not on file    Gets together: Not on file    Attends religious service: Not on file    Active member of club or organization: Not on file    Attends meetings of clubs or organizations: Not on file    Relationship status: Not on file  . Intimate partner violence:    Fear of current or ex partner: Not on file    Emotionally abused: Not on file    Physically abused: Not on file    Forced sexual activity: Not on file  Other Topics Concern  . Not on file  Social History Narrative  . Not on file    ROS Review of Systems  Constitutional: Positive for fatigue. Negative for activity change, appetite change, chills, diaphoresis, fever and unexpected weight change.  HENT: Negative for congestion.   Eyes: Negative for visual disturbance.  Respiratory: Negative for cough, chest tightness, shortness of breath, wheezing and stridor.   Cardiovascular: Negative for chest pain, palpitations and leg swelling.  Gastrointestinal: Negative for abdominal distention, abdominal pain, anal bleeding, blood in stool, constipation, diarrhea, nausea and vomiting.  Endocrine: Negative for cold intolerance, heat intolerance, polydipsia, polyphagia and polyuria.  Genitourinary: Negative for difficulty urinating  and flank pain.  Musculoskeletal: Negative for arthralgias, back pain, gait problem, joint swelling, myalgias, neck pain and neck stiffness.  Skin: Negative for color change, pallor and rash.  Neurological: Negative for dizziness and headaches.  Hematological: Does not bruise/bleed easily.  Psychiatric/Behavioral: Negative for agitation, behavioral problems, confusion, decreased concentration, dysphoric mood, hallucinations, self-injury, sleep disturbance and suicidal ideas. The patient is not nervous/anxious and is not hyperactive.     Objective:   Today's  Vitals: BP 117/77   Pulse 72   Temp 97.9 F (36.6 C) (Oral)   Ht 5' 10.5" (1.791 m)   Wt 203 lb 9.6 oz (92.4 kg)   SpO2 96% Comment: on RA  BMI 28.80 kg/m   Physical Exam  Constitutional: He is oriented to person, place, and time. He appears well-developed and well-nourished. No distress.  HENT:  Head: Normocephalic and atraumatic.  Right Ear: External ear normal.  Left Ear: External ear normal.  Nose: Nose normal.  Mouth/Throat: Oropharynx is clear and moist.  Eyes: Pupils are equal, round, and reactive to light. Conjunctivae and EOM are normal.  Cardiovascular: Normal rate, regular rhythm, normal heart sounds and intact distal pulses.  No murmur heard. Pulmonary/Chest: Effort normal and breath sounds normal. No stridor. No respiratory distress. He has no wheezes. He has no rales. He exhibits no tenderness.  Neurological: He is alert and oriented to person, place, and time.  Skin: Skin is warm and dry. Capillary refill takes less than 2 seconds. No rash noted. He is not diaphoretic. No erythema. No pallor.  Psychiatric: He has a normal mood and affect. His behavior is normal. Judgment and thought content normal.  Nursing note and vitals reviewed.   Assessment & Plan:   Problem List Items Addressed This Visit      Other   Healthcare maintenance - Primary    Increase water intake, strive for at least 100 ounces/day.   Follow Mediterranean diet and try to eat three balanced meals a day. Increase regular exercise.  Recommend at least 30 minutes daily, 5 days per week of walking, jogging, biking, swimming, YouTube/Pinterest workout videos. We will call you when lab results are available. Please schedule complete physical in 4 weeks.      Relevant Orders   CBC with Differential/Platelet   Comprehensive metabolic panel   Hemoglobin A1c   Lipid panel   TSH      Outpatient Encounter Medications as of 03/04/2018  Medication Sig  . ibuprofen (ADVIL,MOTRIN) 400 MG tablet Take  400 mg by mouth every 6 (six) hours as needed for mild pain.  . [DISCONTINUED] acetaminophen (TYLENOL) 325 MG tablet Take 650 mg by mouth every 6 (six) hours as needed for mild pain.  . [DISCONTINUED] amoxicillin-clavulanate (AUGMENTIN) 875-125 MG tablet Take 1 tablet by mouth 2 (two) times daily.  . [DISCONTINUED] azelastine (ASTELIN) 0.1 % nasal spray Place 1 spray into both nostrils 2 (two) times daily. Use in each nostril as directed  . [DISCONTINUED] fluticasone (FLONASE) 50 MCG/ACT nasal spray Place 1 spray into both nostrils daily.   No facility-administered encounter medications on file as of 03/04/2018.     Follow-up: Return in about 4 weeks (around 04/01/2018) for CPE.   Julaine FusiKaty D Jaree Trinka, NP

## 2018-03-04 ENCOUNTER — Encounter: Payer: Self-pay | Admitting: Adult Health

## 2018-03-04 ENCOUNTER — Ambulatory Visit (INDEPENDENT_AMBULATORY_CARE_PROVIDER_SITE_OTHER): Payer: 59 | Admitting: Adult Health

## 2018-03-04 VITALS — BP 117/77 | HR 72 | Temp 97.9°F | Ht 70.5 in | Wt 203.6 lb

## 2018-03-04 DIAGNOSIS — Z0000 Encounter for general adult medical examination without abnormal findings: Secondary | ICD-10-CM | POA: Diagnosis not present

## 2018-03-04 NOTE — Patient Instructions (Addendum)
Mediterranean Diet A Mediterranean diet refers to food and lifestyle choices that are based on the traditions of countries located on the [place]. This way of eating has been shown to help prevent certain conditions and improve outcomes for people who have chronic diseases, like kidney disease and heart disease. What are tips for following this plan? Lifestyle  Cook and eat meals together with your family, when possible.  Drink enough fluid to keep your urine clear or pale yellow.  Be physically active every day. This includes: ? Aerobic exercise like running or swimming. ? Leisure activities like gardening, walking, or housework.  Get 7-8 hours of sleep each night.  If recommended by your health care provider, drink red wine in moderation. This means 1 glass a day for nonpregnant women and 2 glasses a day for men. A glass of wine equals 5 oz (150 mL). Reading food labels  Check the serving size of packaged foods. For foods such as rice and pasta, the serving size refers to the amount of cooked product, not dry.  Check the total fat in packaged foods. Avoid foods that have saturated fat or trans fats.  Check the ingredients list for added sugars, such as corn syrup. Shopping  At the grocery store, buy most of your food from the areas near the walls of the store. This includes: ? Fresh fruits and vegetables (produce). ? Grains, beans, nuts, and seeds. Some of these may be available in unpackaged forms or large amounts (in bulk). ? Fresh seafood. ? Poultry and eggs. ? Low-fat dairy products.  Buy whole ingredients instead of prepackaged foods.  Buy fresh fruits and vegetables in-season from local farmers markets.  Buy frozen fruits and vegetables in resealable bags.  If you do not have access to quality fresh seafood, buy precooked frozen shrimp or canned fish, such as tuna, salmon, or sardines.  Buy small amounts of raw or cooked vegetables, salads, or olives from the  deli or salad bar at your store.  Stock your pantry so you always have certain foods on hand, such as olive oil, canned tuna, canned tomatoes, rice, pasta, and beans. Cooking  Cook foods with extra-virgin olive oil instead of using butter or other vegetable oils.  Have meat as a side dish, and have vegetables or grains as your main dish. This means having meat in small portions or adding small amounts of meat to foods like pasta or stew.  Use beans or vegetables instead of meat in common dishes like chili or lasagna.  Experiment with different cooking methods. Try roasting or broiling vegetables instead of steaming or sauteing them.  Add frozen vegetables to soups, stews, pasta, or rice.  Add nuts or seeds for added healthy fat at each meal. You can add these to yogurt, salads, or vegetable dishes.  Marinate fish or vegetables using olive oil, lemon juice, garlic, and fresh herbs. Meal planning  Plan to eat 1 vegetarian meal one day each week. Try to work up to 2 vegetarian meals, if possible.  Eat seafood 2 or more times a week.  Have healthy snacks readily available, such as: ? Vegetable sticks with hummus. ? Greek yogurt. ? Fruit and nut trail mix.  Eat balanced meals throughout the week. This includes: ? Fruit: 2-3 servings a day ? Vegetables: 4-5 servings a day ? Low-fat dairy: 2 servings a day ? Fish, poultry, or lean meat: 1 serving a day ? Beans and legumes: 2 or more servings a week ? Nuts   and seeds: 1-2 servings a day ? Whole grains: 6-8 servings a day ? Extra-virgin olive oil: 3-4 servings a day  Limit red meat and sweets to only a few servings a month What are my food choices?  Mediterranean diet ? Recommended ? Grains: Whole-grain pasta. Brown rice. Bulgar wheat. Polenta. Couscous. Whole-wheat bread. Orpah Cobbatmeal. Quinoa. ? Vegetables: Artichokes. Beets. Broccoli. Cabbage. Carrots. Eggplant. Green beans. Chard. Kale. Spinach. Onions. Leeks. Peas. Squash.  Tomatoes. Peppers. Radishes. ? Fruits: Apples. Apricots. Avocado. Berries. Bananas. Cherries. Dates. Figs. Grapes. Lemons. Melon. Oranges. Peaches. Plums. Pomegranate. ? Meats and other protein foods: Beans. Almonds. Sunflower seeds. Pine nuts. Peanuts. Cod. Salmon. Scallops. Shrimp. Tuna. Tilapia. Clams. Oysters. Eggs. ? Dairy: Low-fat milk. Cheese. Greek yogurt. ? Beverages: Water. Red wine. Herbal tea. ? Fats and oils: Extra virgin olive oil. Avocado oil. Grape seed oil. ? Sweets and desserts: [place] yogurt with honey. Baked apples. Poached pears. Trail mix. ? Seasoning and other foods: Basil. Cilantro. Coriander. Cumin. Mint. Parsley. Sage. Rosemary. Tarragon. Garlic. Oregano. Thyme. Pepper. Balsalmic vinegar. Tahini. Hummus. Tomato sauce. Olives. Mushrooms. ? Limit these ? Grains: Prepackaged pasta or rice dishes. Prepackaged cereal with added sugar. ? Vegetables: Deep fried potatoes (french fries). ? Fruits: Fruit canned in syrup. ? Meats and other protein foods: Beef. Pork. Lamb. Poultry with skin. Hot dogs. Tomasa BlaseBacon. ? Dairy: Ice cream. Sour cream. Whole milk. ? Beverages: Juice. Sugar-sweetened soft drinks. Beer. Liquor and spirits. ? Fats and oils: Butter. Canola oil. Vegetable oil. Beef fat (tallow). Lard. ? Sweets and desserts: Cookies. Cakes. Pies. Candy. ? Seasoning and other foods: Mayonnaise. Premade sauces and marinades. ? The items listed may not be a complete list. Talk with your dietitian about what dietary choices are right for you. Summary  The Mediterranean diet includes both food and lifestyle choices.  Eat a variety of fresh fruits and vegetables, beans, nuts, seeds, and whole grains.  Limit the amount of red meat and sweets that you eat.  Talk with your health care provider about whether it is safe for you to drink red wine in moderation. This means 1 glass a day for nonpregnant women and 2 glasses a day for men. A glass of wine equals 5 oz (150 mL). This information  is not intended to replace advice given to you by your health care provider. Make sure you discuss any questions you have with your health care provider. Document Released: 11/24/2015 Document Revised: 12/27/2015 Document Reviewed: 11/24/2015 Elsevier Interactive Patient Education  2018 [employer]   Increase water intake, strive for at least 100 ounces/day.   Follow Mediterranean diet and try to eat three balanced meals a day. Increase regular exercise.  Recommend at least 30 minutes daily, 5 days per week of walking, jogging, biking, swimming, YouTube/Pinterest workout videos. We will call you when lab results are available. Please schedule complete physical in 4 weeks. WELCOME TO THE PRACTICE!

## 2018-03-04 NOTE — Assessment & Plan Note (Signed)
Increase water intake, strive for at least 100 ounces/day.   Follow Mediterranean diet and try to eat three balanced meals a day. Increase regular exercise.  Recommend at least 30 minutes daily, 5 days per week of walking, jogging, biking, swimming, YouTube/Pinterest workout videos. We will call you when lab results are available. Please schedule complete physical in 4 weeks.

## 2018-03-06 LAB — COMPREHENSIVE METABOLIC PANEL
ALBUMIN: 4.5 g/dL (ref 3.5–5.5)
ALT: 17 IU/L (ref 0–44)
AST: 16 IU/L (ref 0–40)
Albumin/Globulin Ratio: 2 (ref 1.2–2.2)
Alkaline Phosphatase: 77 IU/L (ref 39–117)
BILIRUBIN TOTAL: 0.4 mg/dL (ref 0.0–1.2)
BUN / CREAT RATIO: 15 (ref 9–20)
BUN: 17 mg/dL (ref 6–24)
CO2: 23 mmol/L (ref 20–29)
CREATININE: 1.12 mg/dL (ref 0.76–1.27)
Calcium: 9.4 mg/dL (ref 8.7–10.2)
Chloride: 101 mmol/L (ref 96–106)
GFR calc Af Amer: 92 mL/min/{1.73_m2} (ref >59)
GFR calc non Af Amer: 79 mL/min/{1.73_m2} (ref >59)
GLOBULIN, TOTAL: 2.2 g/dL (ref 1.5–4.5)
GLUCOSE: 82 mg/dL (ref 65–99)
Potassium: 3.9 mmol/L (ref 3.5–5.2)
SODIUM: 139 mmol/L (ref 134–144)
TOTAL PROTEIN: 6.7 g/dL (ref 6.0–8.5)

## 2018-03-06 LAB — TSH: TSH: 0.747 u[IU]/mL (ref 0.450–4.500)

## 2018-03-06 LAB — LIPID PANEL
CHOL/HDL RATIO: 6.1 ratio — AB (ref 0.0–5.0)
Cholesterol, Total: 189 mg/dL (ref 100–199)
HDL: 31 mg/dL — AB (ref >39)
Triglycerides: 416 mg/dL — ABNORMAL HIGH (ref 0–149)

## 2018-03-06 LAB — HEMOGLOBIN A1C

## 2018-03-06 LAB — CBC WITH DIFFERENTIAL/PLATELET

## 2018-03-10 ENCOUNTER — Other Ambulatory Visit: Payer: Self-pay

## 2018-03-10 DIAGNOSIS — Z0000 Encounter for general adult medical examination without abnormal findings: Secondary | ICD-10-CM

## 2019-05-15 ENCOUNTER — Telehealth: Payer: Self-pay | Admitting: Adult Health

## 2019-05-15 DIAGNOSIS — M722 Plantar fascial fibromatosis: Secondary | ICD-10-CM

## 2019-05-15 NOTE — Telephone Encounter (Signed)
Patient has the [facility] plan which needs a PCP referral for other providers and is requesting a referral to the [facility] for ongoing plantar pain of the right foot. Please place referral if appropriate.

## 2019-05-15 NOTE — Telephone Encounter (Signed)
Referral placed.  T. Janie Strothman, CMA 

## 2019-05-15 NOTE — Addendum Note (Signed)
Addended by: Stan Head on: 05/15/2019 11:38 AM   Modules accepted: Orders

## 2019-05-22 ENCOUNTER — Encounter: Payer: Self-pay | Admitting: Podiatry

## 2019-05-22 ENCOUNTER — Other Ambulatory Visit: Payer: Self-pay

## 2019-05-22 ENCOUNTER — Ambulatory Visit (INDEPENDENT_AMBULATORY_CARE_PROVIDER_SITE_OTHER): Payer: No Typology Code available for payment source

## 2019-05-22 ENCOUNTER — Ambulatory Visit: Payer: 59 | Admitting: Podiatry

## 2019-05-22 ENCOUNTER — Other Ambulatory Visit: Payer: Self-pay | Admitting: Podiatry

## 2019-05-22 VITALS — BP 143/95 | HR 83 | Temp 97.4°F | Resp 16

## 2019-05-22 DIAGNOSIS — M722 Plantar fascial fibromatosis: Secondary | ICD-10-CM

## 2019-05-22 DIAGNOSIS — M79671 Pain in right foot: Secondary | ICD-10-CM

## 2019-05-22 DIAGNOSIS — M79672 Pain in left foot: Secondary | ICD-10-CM

## 2019-05-22 MED ORDER — DICLOFENAC SODIUM 75 MG PO TBEC: 75.0000 mg | DELAYED_RELEASE_TABLET | Freq: Two times a day (BID) | ORAL | 2 refills | Status: DC

## 2019-05-22 MED FILL — DICLOFENAC SOD EC 75 MG TAB: 75 | 25 days supply | Qty: 50 | Fill #0

## 2019-05-22 NOTE — Patient Instructions (Signed)

## 2019-05-22 NOTE — Progress Notes (Signed)
   Subjective:    Patient ID: Donald Crawford, male    DOB: [birth year], 44 y.o.   MRN: [id number]  HPI    Review of Systems  All other systems reviewed and are negative.      Objective:   Physical Exam        Assessment & Plan:

## 2019-05-25 NOTE — Progress Notes (Signed)
Subjective:   Patient ID: Donald Crawford, male   DOB: 44 y.o.   MRN: [id number]   HPI Patient presents stating that the right heel has been very tender for the last several months and gradually becoming more of an issue.  States that the left forefoot is also bothering him but not to the same degree as the right and it is becoming increasingly hard for him to be active.  Patient does not smoke and would like to be more active   Review of Systems  All other systems reviewed and are negative.       Objective:  Physical Exam Vitals and nursing note reviewed.  Constitutional:      Appearance: He is well-developed.  Pulmonary:     Effort: Pulmonary effort is normal.  Musculoskeletal:        General: Normal range of motion.  Skin:    General: Skin is warm.  Neurological:     Mental Status: He is alert.     Neurovascular status found to be intact muscle strength was found to be adequate range of motion within normal limits.  Patient is noted to have exquisite discomfort plantar aspect right heel at the insertional point of the tendon into the calcaneus with inflammation fluid of the medial band.  Patient is found to have good digital perfusion well oriented x3 and has moderate discomfort of the lesser MPJs left with no one that seems to be the worst 1.     Assessment:  Acute plantar fasciitis right with inflammation noted and also inflammation forefoot left which is most likely compensatory in nature     Plan:  H&P conditions reviewed explained.  At this point for the right I went ahead and I did sterile prep and I injected directly into the fascia 3 mg Kenalog 5 mg Xylocaine and I applied fascial brace.  I gave instructions for supportive therapy I gave instructions for shoe gear modifications and for the left we will hopefully improve as time goes on and I also discussed long-term orthotics which we will discuss at next visit.  Placed on diclofenac 75 mg twice daily also at this  time  X-rays indicate small spur no indications of stress fracture arthritis or forefoot conditions left

## 2019-06-12 ENCOUNTER — Ambulatory Visit: Payer: No Typology Code available for payment source | Admitting: Podiatry

## 2019-06-12 ENCOUNTER — Other Ambulatory Visit: Payer: Self-pay

## 2019-06-12 ENCOUNTER — Encounter: Payer: Self-pay | Admitting: Podiatry

## 2019-06-12 VITALS — Temp 98.0°F

## 2019-06-12 DIAGNOSIS — M722 Plantar fascial fibromatosis: Secondary | ICD-10-CM | POA: Diagnosis not present

## 2019-06-12 NOTE — Progress Notes (Signed)
Subjective:   Patient ID: Donald Crawford, male   DOB: 44 y.o.   MRN: [id number]   HPI Patient presents stating he is significantly improved with discomfort if he has been on it for too long but overall much better than previous   ROS      Objective:  Physical Exam  Neurovascular status intact with patient's right heel doing much better with pain still noted upon deep palpation but overall significant improvement      Assessment:  Fasciitis symptomatology right which is improved and mildly tender     Plan:  H&P reviewed condition and recommended continued physical therapy anti-inflammatories and support.  Patient is discharged but will be seen back if symptoms were to recur or get worse

## 2019-06-21 ENCOUNTER — Ambulatory Visit: Payer: Self-pay

## 2020-01-13 ENCOUNTER — Encounter: Payer: Self-pay | Admitting: Physician Assistant

## 2020-01-13 ENCOUNTER — Ambulatory Visit (INDEPENDENT_AMBULATORY_CARE_PROVIDER_SITE_OTHER): Payer: No Typology Code available for payment source | Admitting: Physician Assistant

## 2020-01-13 ENCOUNTER — Other Ambulatory Visit: Payer: Self-pay

## 2020-01-13 VITALS — BP 119/76 | HR 76 | Ht 71.0 in | Wt 199.4 lb

## 2020-01-13 DIAGNOSIS — Z1211 Encounter for screening for malignant neoplasm of colon: Secondary | ICD-10-CM | POA: Diagnosis not present

## 2020-01-13 DIAGNOSIS — Z0000 Encounter for general adult medical examination without abnormal findings: Secondary | ICD-10-CM | POA: Diagnosis not present

## 2020-01-13 DIAGNOSIS — K429 Umbilical hernia without obstruction or gangrene: Secondary | ICD-10-CM

## 2020-01-13 NOTE — Progress Notes (Signed)
Male physical   Impression and Recommendations:    1. Healthcare maintenance   2. Umbilical hernia without obstruction and without gangrene   3. Encounter for screening colonoscopy      1) Anticipatory Guidance: Discussed skin CA prevention and sunscreen when outside along with skin surveillance; eating a balanced and modest diet; physical activity at least 25 minutes per day or minimum of 150 min/ week moderate to intense activity.  2) Immunizations / Screenings / Labs:   All immunizations are up-to-date per recommendations or will be updated today if pt allows.    - Patient understands with dental and vision screens they will schedule independently.  - Will obtain CBC, CMP, HgA1c, Lipid panel, and TSH when fasting. - Will place order for colonoscopy. - Declined Tdap, Hep and HIV screenings.  3) Weight:  BMI meaning discussed with patient.  Discussed goal to improve diet habits to improve overall feelings of well being and objective health data. Improve nutrient density of diet through increasing intake of fruits and vegetables and decreasing saturated fats, white flour products and refined sugars.   4) Healthcare Maintenance: -Follow a heart healthy diet and continue to reduce fried foods. -Stay as active as possible. -Increase hydration. -Follow up in 1 yr for CPE and FBW or sooner if needed  Orders Placed This Encounter  Procedures  . CBC  . Comprehensive metabolic panel    Order Specific Question:   Has the patient fasted?    Answer:   Yes  . Hemoglobin A1c  . TSH  . Lipid panel    Order Specific Question:   Has the patient fasted?    Answer:   Yes  . Ambulatory referral to Gastroenterology    Referral Priority:   Routine    Referral Type:   Consultation    Referral Reason:   Specialty Services Required    Number of Visits Requested:   1    No orders of the defined types were placed in this encounter.    Return in about 1 year (around 01/12/2021) for CPE  and FBW.    Gross side effects, risk and benefits, and alternatives of medications discussed with patient.  Patient is aware that all medications have potential side effects and we are unable to predict every side effect or drug-drug interaction that may occur.  Expresses verbal understanding and consents to current therapy plan and treatment regimen.  Please see AVS handed out to patient at the end of our visit for further patient instructions/ counseling done pertaining to today's office visit.  Note:  This note was prepared with assistance of Dragon voice recognition software. Occasional wrong-word or sound-a-like substitutions may have occurred due to the inherent limitations of voice recognition software.    Subjective:       CC: CPE   HPI: Donald Crawford is a 44 y.o. male who presents to [facility] today for a yearly health maintenance exam.     Health Maintenance Summary  - Reviewed and updated, unless pt declines services.  Last Cologuard or Colonoscopy:   Placed order for screening colonoscopy. Tobacco History Reviewed:  Y, never a smoker Alcohol / drug use:  No concerns, no excessive use / no use Exercise Habits:  Staying busy at work, no exercise regimen Dental Home:  Y Eye exams: Y, not recently Dermatology home: N  Male history: STD concerns:   none Additional penile/ urinary concerns:  none   Additional concerns  beyond Health Maintenance issues:        There is no immunization history on file for this patient.   Health Maintenance  Topic Date Due  . Hepatitis C Screening  Never done  . HIV Screening  Never done  . TETANUS/TDAP  Never done  . INFLUENZA VACCINE  Never done       Wt Readings from Last 3 Encounters:  01/13/20 199 lb 6.4 oz (90.4 kg)  03/04/18 203 lb 9.6 oz (92.4 kg)  03/20/15 194 lb 14.2 oz (88.4 kg)   BP Readings from Last 3 Encounters:  01/13/20 119/76  05/22/19 (!) 143/95  03/04/18 117/77    Pulse Readings from Last 3 Encounters:  01/13/20 76  05/22/19 83  03/04/18 72    Patient Active Problem List   Diagnosis Date Noted  . Healthcare maintenance 03/04/2018  . Diverticulitis of large intestine with perforation and abscess 03/20/2015    Past Medical History:  Diagnosis Date  . Diverticulitis   . Hyperlipidemia    Phreesia 01/11/2020  . Seasonal allergies     History reviewed. No pertinent surgical history.  History reviewed. No pertinent family history.  Social History   Substance and Sexual Activity  Drug Use No  ,  Social History   Substance and Sexual Activity  Alcohol Use Yes   Comment: occassionally  ,  Social History   Tobacco Use  Smoking Status Never Smoker  Smokeless Tobacco Never Used  ,  Social History   Substance and Sexual Activity  Sexual Activity Yes  . Birth control/protection: None    Patient's Medications  New Prescriptions   No medications on file  Previous Medications   DICLOFENAC (VOLTAREN) 75 MG EC TABLET    Take 1 tablet (75 mg total) by mouth 2 (two) times daily.   IBUPROFEN (ADVIL,MOTRIN) 400 MG TABLET    Take 400 mg by mouth every 6 (six) hours as needed for mild pain.  Modified Medications   No medications on file  Discontinued Medications   No medications on file    Amoxicillin  Review of Systems: General:   Denies fever, chills, unexplained weight loss.  Optho/Auditory:   Denies visual changes, blurred vision/LOV Respiratory:   Denies SOB, DOE more than baseline levels.   Cardiovascular:   Denies chest pain, palpitations, new onset peripheral edema  Gastrointestinal:   Denies nausea, vomiting, diarrhea.  Genitourinary: Denies dysuria, freq/ urgency, flank pain or discharge from genitals.  Endocrine:     Denies hot or cold intolerance, polyuria, polydipsia. Musculoskeletal:   Denies unexplained myalgias, joint swelling, unexplained arthralgias, gait problems.  Skin:  Denies rash, suspicious  lesions Neurological:     Denies dizziness, unexplained weakness, numbness  Psychiatric/Behavioral:   Denies mood changes, suicidal or homicidal ideations, hallucinations    Objective:     Blood pressure 119/76, pulse 76, height 5\' 11"  (1.803 m), weight 199 lb 6.4 oz (90.4 kg), SpO2 (!) 76 %. Body mass index is 27.81 kg/m. General Appearance:    Alert, cooperative, no distress, appears stated age  Head:    Normocephalic, without obvious abnormality, atraumatic  Eyes:    PERRL, conjunctiva/corneas clear, EOM's intact, both eyes  Ears:    Normal TM's and external ear canals, both ears  Nose:   Nares normal, septum midline, mucosa normal, no drainage    or sinus tenderness  Throat:   Lips w/o lesion, mucosa moist, and tongue normal; teeth and gums normal  Neck:   Supple, symmetrical, trachea midline,  no adenopathy;    thyroid:  no enlargement/tenderness/nodules; no carotid   bruit or JVD  Back:     Symmetric, no curvature, ROM normal, no CVA tenderness  Lungs:     Clear to auscultation bilaterally, respirations unlabored, no  Wh/ R/ R  Chest Wall:    No tenderness or gross deformity; normal excursion   Heart:    Regular rate and rhythm, S1 and S2 normal, no murmur, rub   or gallop  Abdomen:     Soft, non-tender, bowel sounds active all four quadrants, No G/R/R, no masses, small umbilical hernia present  Genitalia:    Deferred. No concerns/sxs.  Rectal:    Deferred. No concerns/sxs. Placed order for colonoscopy.  Extremities:   Extremities normal, atraumatic, no cyanosis or gross edema  Pulses:   Normal  Skin:   Warm, dry, Skin color, texture, turgor normal, no obvious rashes or lesions  M-Sk:   Ambulates * 4 w/o difficulty, no gross deformities, tone WNL  Neurologic:   CNII-XII grossly intact, normal strength, sensation and reflexes    Throughout Psych:  No HI/SI, judgement and insight good, Euthymic mood. Full Affect.

## 2020-01-13 NOTE — Patient Instructions (Signed)
Preventive Care 40-46 Years Old, Male °Preventive care refers to lifestyle choices and visits with your health care provider that can promote health and wellness. This includes: °· A yearly physical exam. This is also called an annual well check. °· Regular dental and eye exams. °· Immunizations. °· Screening for certain conditions. °· Healthy lifestyle choices, such as eating a healthy diet, getting regular exercise, not using drugs or products that contain nicotine and tobacco, and limiting alcohol use. °What can I expect for my preventive care visit? °Physical exam °Your health care provider will check: °· Height and weight. These may be used to calculate body mass index (BMI), which is a measurement that tells if you are at a healthy weight. °· Heart rate and blood pressure. °· Your skin for abnormal spots. °Counseling °Your health care provider may ask you questions about: °· Alcohol, tobacco, and drug use. °· Emotional well-being. °· Home and relationship well-being. °· Sexual activity. °· Eating habits. °· Work and work environment. °What immunizations do I need? ° °Influenza (flu) vaccine °· This is recommended every year. °Tetanus, diphtheria, and pertussis (Tdap) vaccine °· You may need a Td booster every 10 years. °Varicella (chickenpox) vaccine °· You may need this vaccine if you have not already been vaccinated. °Zoster (shingles) vaccine °· You may need this after age 46. °Measles, mumps, and rubella (MMR) vaccine °· You may need at least one dose of MMR if you were born in 1957 or later. You may also need a second dose. °Pneumococcal conjugate (PCV13) vaccine °· You may need this if you have certain conditions and were not previously vaccinated. °Pneumococcal polysaccharide (PPSV23) vaccine °· You may need one or two doses if you smoke cigarettes or if you have certain conditions. °Meningococcal conjugate (MenACWY) vaccine °· You may need this if you have certain conditions. °Hepatitis A  vaccine °· You may need this if you have certain conditions or if you travel or work in places where you may be exposed to hepatitis A. °Hepatitis B vaccine °· You may need this if you have certain conditions or if you travel or work in places where you may be exposed to hepatitis B. °Haemophilus influenzae type b (Hib) vaccine °· You may need this if you have certain risk factors. °Human papillomavirus (HPV) vaccine °· If recommended by your health care provider, you may need three doses over 6 months. °You may receive vaccines as individual doses or as more than one vaccine together in one shot (combination vaccines). Talk with your health care provider about the risks and benefits of combination vaccines. °What tests do I need? °Blood tests °· Lipid and cholesterol levels. These may be checked every 5 years, or more frequently if you are over 46 years old. °· Hepatitis C test. °· Hepatitis B test. °Screening °· Lung cancer screening. You may have this screening every year starting at age 46 if you have a 30-pack-year history of smoking and currently smoke or have quit within the past 15 years. °· Prostate cancer screening. Recommendations will vary depending on your family history and other risks. °· Colorectal cancer screening. All adults should have this screening starting at age 46 and continuing until age 75. Your health care provider may recommend screening at age 45 if you are at increased risk. You will have tests every 1-46 years, depending on your results and the type of screening test. °· Diabetes screening. This is done by checking your blood sugar (glucose) after you have not eaten   for a while (fasting). You may have this done every 1-3 years.  Sexually transmitted disease (STD) testing. Follow these instructions at home: Eating and drinking  Eat a diet that includes fresh fruits and vegetables, whole grains, lean protein, and low-fat dairy products.  Take vitamin and mineral supplements as  recommended by your health care provider.  Do not drink alcohol if your health care provider tells you not to drink.  If you drink alcohol: ? Limit how much you have to 0-2 drinks a day. ? Be aware of how much alcohol is in your drink. In the [place] one drink equals one 12 oz bottle of beer (355 mL), one 5 oz glass of wine (148 mL), or one 1 oz glass of hard liquor (44 mL). Lifestyle  Take daily care of your teeth and gums.  Stay active. Exercise for at least 30 minutes on 5 or more days each week.  Do not use any products that contain nicotine or tobacco, such as cigarettes, e-cigarettes, and chewing tobacco. If you need help quitting, ask your health care provider.  If you are sexually active, practice safe sex. Use a condom or other form of protection to prevent STIs (sexually transmitted infections).  Talk with your health care provider about taking a low-dose aspirin every day starting at age 46. What's next?  Go to your health care provider once a year for a well check visit.  Ask your health care provider how often you should have your eyes and teeth checked.  Stay up to date on all vaccines. This information is not intended to replace advice given to you by your health care provider. Make sure you discuss any questions you have with your health care provider. Document Revised: 03/27/2018 Document Reviewed: 03/27/2018 Elsevier Patient Education  2020 Elsevier Inc.  

## 2020-01-14 LAB — CBC
Hematocrit: 46.5 % (ref 37.5–51.0)
Hemoglobin: 15.7 g/dL (ref 13.0–17.7)
MCH: 28.3 pg (ref 26.6–33.0)
MCHC: 33.8 g/dL (ref 31.5–35.7)
MCV: 84 fL (ref 79–97)
Platelets: 222 10*3/uL (ref 150–450)
RBC: 5.55 x10E6/uL (ref 4.14–5.80)
RDW: 13 % (ref 11.6–15.4)
WBC: 6.8 10*3/uL (ref 3.4–10.8)

## 2020-01-14 LAB — COMPREHENSIVE METABOLIC PANEL
ALT: 16 IU/L (ref 0–44)
AST: 15 IU/L (ref 0–40)
Albumin/Globulin Ratio: 1.9 (ref 1.2–2.2)
Albumin: 4.6 g/dL (ref 4.0–5.0)
Alkaline Phosphatase: 81 IU/L (ref 44–121)
BUN/Creatinine Ratio: 16 (ref 9–20)
BUN: 19 mg/dL (ref 6–24)
Bilirubin Total: 0.4 mg/dL (ref 0.0–1.2)
CO2: 24 mmol/L (ref 20–29)
Calcium: 9.7 mg/dL (ref 8.7–10.2)
Chloride: 103 mmol/L (ref 96–106)
Creatinine, Ser: 1.19 mg/dL (ref 0.76–1.27)
GFR calc Af Amer: 84 mL/min/{1.73_m2} (ref >59)
GFR calc non Af Amer: 73 mL/min/{1.73_m2} (ref >59)
Globulin, Total: 2.4 g/dL (ref 1.5–4.5)
Glucose: 89 mg/dL (ref 65–99)
Potassium: 4.7 mmol/L (ref 3.5–5.2)
Sodium: 140 mmol/L (ref 134–144)
Total Protein: 7 g/dL (ref 6.0–8.5)

## 2020-01-14 LAB — HEMOGLOBIN A1C
Est. average glucose Bld gHb Est-mCnc: 117 mg/dL
Hgb A1c MFr Bld: 5.7 % — ABNORMAL HIGH (ref 4.8–5.6)

## 2020-01-14 LAB — TSH: TSH: 1.01 u[IU]/mL (ref 0.450–4.500)

## 2020-01-14 LAB — LIPID PANEL
Chol/HDL Ratio: 7.5 ratio — ABNORMAL HIGH (ref 0.0–5.0)
Cholesterol, Total: 232 mg/dL — ABNORMAL HIGH (ref 100–199)
HDL: 31 mg/dL — ABNORMAL LOW (ref >39)
LDL Chol Calc (NIH): 150 mg/dL — ABNORMAL HIGH (ref 0–99)
Triglycerides: 275 mg/dL — ABNORMAL HIGH (ref 0–149)
VLDL Cholesterol Cal: 51 mg/dL — ABNORMAL HIGH (ref 5–40)

## 2020-08-14 ENCOUNTER — Telehealth: Payer: No Typology Code available for payment source | Admitting: Nurse Practitioner

## 2020-08-14 DIAGNOSIS — J329 Chronic sinusitis, unspecified: Secondary | ICD-10-CM | POA: Diagnosis not present

## 2020-08-14 DIAGNOSIS — B9689 Other specified bacterial agents as the cause of diseases classified elsewhere: Secondary | ICD-10-CM | POA: Diagnosis not present

## 2020-08-14 MED ORDER — DOXYCYCLINE HYCLATE 100 MG PO TABS: 100.0000 mg | ORAL_TABLET | Freq: Two times a day (BID) | ORAL | 0 refills | Status: AC

## 2020-08-14 NOTE — Progress Notes (Signed)
We are sorry that you are not feeling well.  Here is how we plan to help!  Based on what you have shared with me it looks like you have sinusitis.  Sinusitis is inflammation and infection in the sinus cavities of the head.  Based on your presentation I believe you most likely have Acute Bacterial Sinusitis.  This is an infection caused by bacteria and is treated with antibiotics. I have prescribed Doxycycline 100mg by mouth twice a day for 10 days. You may use an oral decongestant such as Mucinex D or if you have glaucoma or high blood pressure use plain Mucinex. Saline nasal spray help and can safely be used as often as needed for congestion.  If you develop worsening sinus pain, fever or notice severe headache and vision changes, or if symptoms are not better after completion of antibiotic, please schedule an appointment with a health care provider.    Sinus infections are not as easily transmitted as other respiratory infection, however we still recommend that you avoid close contact with loved ones, especially the very young and elderly.  Remember to wash your hands thoroughly throughout the day as this is the number one way to prevent the spread of infection!  Home Care:  Only take medications as instructed by your medical team.  Complete the entire course of an antibiotic.  Do not take these medications with alcohol.  A steam or ultrasonic humidifier can help congestion.  You can place a towel over your head and breathe in the steam from hot water coming from a faucet.  Avoid close contacts especially the very young and the elderly.  Cover your mouth when you cough or sneeze.  Always remember to wash your hands.  Get Help Right Away If:  You develop worsening fever or sinus pain.  You develop a severe head ache or visual changes.  Your symptoms persist after you have completed your treatment plan.  Make sure you  Understand these instructions.  Will watch your  condition.  Will get help right away if you are not doing well or get worse.  Your e-visit answers were reviewed by a board certified advanced clinical practitioner to complete your personal care plan.  Depending on the condition, your plan could have included both over the counter or prescription medications.  If there is a problem please reply  once you have received a response from your provider.  Your safety is important to us.  If you have drug allergies check your prescription carefully.    You can use MyChart to ask questions about today's visit, request a non-urgent call back, or ask for a work or school excuse for 24 hours related to this e-Visit. If it has been greater than 24 hours you will need to follow up with your provider, or enter a new e-Visit to address those concerns.  You will get an e-mail in the next two days asking about your experience.  I hope that your e-visit has been valuable and will speed your recovery. Thank you for using e-visits.  I have spent at least 5 minutes reviewing and documenting in the patient's chart.  

## 2021-01-12 ENCOUNTER — Encounter: Payer: No Typology Code available for payment source | Admitting: Physician Assistant

## 2022-01-26 ENCOUNTER — Ambulatory Visit (INDEPENDENT_AMBULATORY_CARE_PROVIDER_SITE_OTHER): Payer: No Typology Code available for payment source | Admitting: Podiatry

## 2022-01-26 ENCOUNTER — Ambulatory Visit (INDEPENDENT_AMBULATORY_CARE_PROVIDER_SITE_OTHER): Payer: No Typology Code available for payment source

## 2022-01-26 DIAGNOSIS — M722 Plantar fascial fibromatosis: Secondary | ICD-10-CM | POA: Diagnosis not present

## 2022-01-26 DIAGNOSIS — M79672 Pain in left foot: Secondary | ICD-10-CM

## 2022-01-26 MED ORDER — TRIAMCINOLONE ACETONIDE 10 MG/ML IJ SUSP
10.0000 mg | Freq: Once | INTRAMUSCULAR | Status: AC
  Administered 2022-01-26: 10 mg

## 2022-01-28 NOTE — Progress Notes (Signed)
Subjective:   Patient ID: Donald Crawford, male   DOB: 46 y.o.   MRN: [id number]   HPI Patient has developed a lot of pain in the plantar aspect of the left heel with inflammation fluid buildup noted.  Patient also has moderate flatfoot deformity that is part of the condition   ROS      Objective:  Physical Exam  Neurovascular status intact with patient having moderate depression of the arch and inflammation fluid in the medial band left plantar fascia at insertion into the calcaneus     Assessment:  Appears to be acute plantar fasciitis left inflammation fluid in the medial band with moderate depression of the arch     Plan:  H&P reviewed condition sterile prep reinjected the fascia 3 mg Kenalog 5 mg Xylocaine after explaining risk applied fascial brace instructed on shoe gear modifications reduced activity and stretching and reappoint as symptoms indicate  X-rays indicate small spur no indication stress fracture arthritis

## 2022-02-12 ENCOUNTER — Other Ambulatory Visit: Payer: Self-pay | Admitting: Podiatry

## 2022-02-12 DIAGNOSIS — M722 Plantar fascial fibromatosis: Secondary | ICD-10-CM

## 2022-03-03 ENCOUNTER — Telehealth: Payer: No Typology Code available for payment source | Admitting: Nurse Practitioner

## 2022-03-03 DIAGNOSIS — B354 Tinea corporis: Secondary | ICD-10-CM

## 2022-03-03 MED ORDER — TERBINAFINE HCL 1 % EX CREA: 1.0000 | TOPICAL_CREAM | Freq: Two times a day (BID) | CUTANEOUS | 0 refills | Status: AC

## 2022-03-03 NOTE — Progress Notes (Signed)
E Visit for Rash  We are sorry that you are not feeling well. Here is how we plan to help!    This looks like ringworm. Ringworm is a a fungal infection that can spread all over body.   I have prescribed lamisil cream to use 4x a day- apply with a q tip. Good handwashing. Avoid touching or rubbing area.    HOME CARE:  Take cool showers and avoid direct sunlight. Apply cool compress or wet dressings. Take a bath in an oatmeal bath.  Sprinkle content of one Aveeno packet under running faucet with comfortably warm water.  Bathe for 15-20 minutes, 1-2 times daily.  Pat dry with a towel. Do not rub the rash. Use hydrocortisone cream. Take an antihistamine like Benadryl for widespread rashes that itch.  The adult dose of Benadryl is 25-50 mg by mouth 4 times daily. Caution:  This type of medication may cause sleepiness.  Do not drink alcohol, drive, or operate dangerous machinery while taking antihistamines.  Do not take these medications if you have prostate enlargement.  Read package instructions thoroughly on all medications that you take.  GET HELP RIGHT AWAY IF:  Symptoms don't go away after treatment. Severe itching that persists. If you rash spreads or swells. If you rash begins to smell. If it blisters and opens or develops a yellow-brown crust. You develop a fever. You have a sore throat. You become short of breath.  MAKE SURE YOU:  Understand these instructions. Will watch your condition. Will get help right away if you are not doing well or get worse.  Thank you for choosing an e-visit.  Your e-visit answers were reviewed by a board certified advanced clinical practitioner to complete your personal care plan. Depending upon the condition, your plan could have included both over the counter or prescription medications.  Please review your pharmacy choice. Make sure the pharmacy is open so you can pick up prescription now. If there is a problem, you may contact your  provider through [employer] and have the prescription routed to another pharmacy.  Your safety is important to [place]. If you have drug allergies check your prescription carefully.   For the next 24 hours you can use MyChart to ask questions about today's visit, request a non-urgent call back, or ask for a work or school excuse. You will get an email in the next two days asking about your experience. I hope that your e-visit has been valuable and will speed your recovery.   Donald Daphine Deutscher, FNP   5-10 minutes spent reviewing and documenting in chart.

## 2022-04-28 ENCOUNTER — Telehealth: Payer: 59 | Admitting: Nurse Practitioner

## 2022-04-28 DIAGNOSIS — R051 Acute cough: Secondary | ICD-10-CM

## 2022-04-28 DIAGNOSIS — U071 COVID-19: Secondary | ICD-10-CM | POA: Diagnosis not present

## 2022-04-28 MED ORDER — PROMETHAZINE-DM 6.25-15 MG/5ML PO SYRP: 5.0000 mL | ORAL_SOLUTION | Freq: Four times a day (QID) | ORAL | 0 refills | Status: DC | PRN

## 2022-04-28 NOTE — Progress Notes (Signed)
We are sorry that you are not feeling well.  Here is how we plan to help!  Based on your presentation I believe you most likely have A cough due to allergies.  I recommend that you start the an over-the counter-allergy medication such as Claritin 10 mg or Zyrtec 10 mg daily.     In addition you may use promethazine DM 1 tsp po 26 hours prn  This cough is from covid and can last several weeks. You only have a 4 day window lwft for treatmnet with antiviral. If you woild like antiviral treatment, you will need to do a virtual video visit on your my chart.  From your responses in the eVisit questionnaire you describe inflammation in the upper respiratory tract which is causing a significant cough.  This is commonly called Bronchitis and has four common causes:   Allergies Viral Infections Acid Reflux Bacterial Infection Allergies, viruses and acid reflux are treated by controlling symptoms or eliminating the cause. An example might be a cough caused by taking certain blood pressure medications. You stop the cough by changing the medication. Another example might be a cough caused by acid reflux. Controlling the reflux helps control the cough.  USE OF BRONCHODILATOR ("RESCUE") INHALERS: There is a risk from using your bronchodilator too frequently.  The risk is that over-reliance on a medication which only relaxes the muscles surrounding the breathing tubes can reduce the effectiveness of medications prescribed to reduce swelling and congestion of the tubes themselves.  Although you feel brief relief from the bronchodilator inhaler, your asthma may actually be worsening with the tubes becoming more swollen and filled with mucus.  This can delay other crucial treatments, such as oral steroid medications. If you need to use a bronchodilator inhaler daily, several times per day, you should discuss this with your provider.  There are probably better treatments that could be used to keep your asthma under  control.     HOME CARE Only take medications as instructed by your medical team. Complete the entire course of an antibiotic. Drink plenty of fluids and get plenty of rest. Avoid close contacts especially the very young and the elderly Cover your mouth if you cough or cough into your sleeve. Always remember to wash your hands A steam or ultrasonic humidifier can help congestion.   GET HELP RIGHT AWAY IF: You develop worsening fever. You become short of breath You cough up blood. Your symptoms persist after you have completed your treatment plan MAKE SURE YOU  Understand these instructions. Will watch your condition. Will get help right away if you are not doing well or get worse.    Thank you for choosing an e-visit.  Your e-visit answers were reviewed by a board certified advanced clinical practitioner to complete your personal care plan. Depending upon the condition, your plan could have included both over the counter or prescription medications.  Please review your pharmacy choice. Make sure the pharmacy is open so you can pick up prescription now. If there is a problem, you may contact your provider through [employer] and have the prescription routed to another pharmacy.  Your safety is important to [place]. If you have drug allergies check your prescription carefully.   For the next 24 hours you can use MyChart to ask questions about today's visit, request a non-urgent call back, or ask for a work or school excuse. You will get an email in the next two days asking about your experience. I hope that  your e-visit has been valuable and will speed your recovery.  Mary-Margaret Hassell Done, FNP   5-10 minutes spent reviewing and documenting in chart.

## 2022-10-24 ENCOUNTER — Encounter: Payer: Self-pay | Admitting: Podiatry

## 2022-10-24 ENCOUNTER — Ambulatory Visit: Payer: 59 | Admitting: Podiatry

## 2022-10-24 DIAGNOSIS — M722 Plantar fascial fibromatosis: Secondary | ICD-10-CM | POA: Diagnosis not present

## 2022-10-24 MED ORDER — TRIAMCINOLONE ACETONIDE 10 MG/ML IJ SUSP
10.0000 mg | Freq: Once | INTRAMUSCULAR | Status: AC
  Administered 2022-10-24: 10 mg

## 2022-10-25 NOTE — Progress Notes (Signed)
Subjective:   Patient ID: Donald Crawford, male   DOB: 47 y.o.   MRN: [id number]   HPI Patient states he has developed a flareup of pain in the left plantar heel at the insertional point tendon calcaneus with approximate 8 months since we have seen him   ROS      Objective:  Physical Exam  Neurovascular status intact exquisite discomfort medial fascial band left insertional point tendon calcaneus     Assessment:  Acute Planter fasciitis left at the insertional point tendon calcaneus     Plan:  Sterile prep injected the plantar fascia at insertion 3 mg Kenalog 5 mg Xylocaine and instructed on support and utilization of splint to stretch the plantar fascia

## 2023-03-14 ENCOUNTER — Encounter: Payer: Self-pay | Admitting: Podiatry

## 2023-06-07 ENCOUNTER — Ambulatory Visit: Payer: 59 | Admitting: Family Medicine

## 2023-07-29 ENCOUNTER — Ambulatory Visit (INDEPENDENT_AMBULATORY_CARE_PROVIDER_SITE_OTHER): Payer: Commercial Managed Care - PPO | Admitting: Family Medicine

## 2023-07-29 ENCOUNTER — Other Ambulatory Visit (HOSPITAL_COMMUNITY): Payer: Self-pay

## 2023-07-29 VITALS — BP 159/124 | HR 80 | Ht 71.0 in | Wt 224.1 lb

## 2023-07-29 DIAGNOSIS — Z1159 Encounter for screening for other viral diseases: Secondary | ICD-10-CM

## 2023-07-29 DIAGNOSIS — E669 Obesity, unspecified: Secondary | ICD-10-CM | POA: Insufficient documentation

## 2023-07-29 DIAGNOSIS — L309 Dermatitis, unspecified: Secondary | ICD-10-CM | POA: Insufficient documentation

## 2023-07-29 DIAGNOSIS — R0683 Snoring: Secondary | ICD-10-CM | POA: Insufficient documentation

## 2023-07-29 DIAGNOSIS — Z7689 Persons encountering health services in other specified circumstances: Secondary | ICD-10-CM | POA: Diagnosis not present

## 2023-07-29 DIAGNOSIS — Z114 Encounter for screening for human immunodeficiency virus [HIV]: Secondary | ICD-10-CM

## 2023-07-29 DIAGNOSIS — Z1211 Encounter for screening for malignant neoplasm of colon: Secondary | ICD-10-CM

## 2023-07-29 DIAGNOSIS — I10 Essential (primary) hypertension: Secondary | ICD-10-CM | POA: Insufficient documentation

## 2023-07-29 DIAGNOSIS — E785 Hyperlipidemia, unspecified: Secondary | ICD-10-CM | POA: Diagnosis not present

## 2023-07-29 DIAGNOSIS — R400 Somnolence: Secondary | ICD-10-CM | POA: Insufficient documentation

## 2023-07-29 DIAGNOSIS — Z8249 Family history of ischemic heart disease and other diseases of the circulatory system: Secondary | ICD-10-CM | POA: Insufficient documentation

## 2023-07-29 MED ORDER — TRIAMCINOLONE ACETONIDE 0.1 % EX CREA
1.0000 | TOPICAL_CREAM | Freq: Two times a day (BID) | CUTANEOUS | 2 refills | Status: AC
  Filled 2023-07-29: qty 30, 15d supply, fill #0
  Filled 2023-10-28: qty 30, 15d supply, fill #1

## 2023-07-29 MED ORDER — VALSARTAN-HYDROCHLOROTHIAZIDE 80-12.5 MG PO TABS
1.0000 | ORAL_TABLET | Freq: Every day | ORAL | 3 refills | Status: DC
Start: 2023-07-29 — End: 2023-08-07
  Filled 2023-07-29: qty 90, 90d supply, fill #0

## 2023-07-29 NOTE — Assessment & Plan Note (Signed)
 Significantly elevated on both readings.  Undiagnosed sleep apnea likely contributing.  Starting valsartan-HCTZ 80-12.5 due to the severity of his blood pressure.  Advised him to check it twice a day and bring back the results the next time he sees us  in 1 month.

## 2023-07-29 NOTE — Assessment & Plan Note (Signed)
 Macular rash on the legs bilaterally.  Prescribing triamcinolone cream

## 2023-07-29 NOTE — Progress Notes (Signed)
 New Patient Office Visit  Subjective   Patient ID: Donald Crawford, male    DOB: 05-Apr-1976  Age: 48 y.o. MRN: [id number]  CC:  Chief Complaint  Patient presents with   New Patient (Initial Visit)    HPI Donald Crawford presents to establish care Patient did not regularly go to the doctor prior to this.  Did see our clinic back in 2021.  Has a history of seasonal allergies, plantar fasciitis, diverticulosis and hyperlipidemia.  For his seasonal allergies takes Flonase as needed.  Also complaining of daytime somnolence and snoring and apnea.  We discussed sleep apnea and sleep study test.  Patient also has spotted rash on his lower extremities that comes and goes.  Has a history of eczema in his past.  Does not currently put anything on it.  Patient's blood pressure was elevated.  We discussed what to do regarding this.  He is starting a blood pressure medication.  Patient would like to have a colonoscopy.  Patient has a family history of cardiac disease in both his mother and father who have had MIs.  Father had first MI before the age of 62.  PMH: Plantar fasciitis, diverticulosis, hyperlipidemia, prediabetes  PSH: no  FH: father and mother - father 46 at first, mother in 8s.  Pgm - stroke.  Father - dm.  Pgm - dm.    Tobacco use: no Alcohol use: occasional Drug use: no Marital status: married.  3 boys.   Employment: works at [employer]. wife works on the cardiac progressive floor at [employer]   Screenings:  Colon Cancer: Needs colonoscopy   Outpatient Encounter Medications as of 07/29/2023  Medication Sig   ibuprofen (ADVIL,MOTRIN) 400 MG tablet Take 400 mg by mouth every 6 (six) hours as needed for mild pain.   terbinafine (LAMISIL AT) 1 % cream Apply 1 Application topically 2 (two) times daily.   triamcinolone cream (KENALOG) 0.1 % Apply 1 Application topically 2 (two) times daily.   valsartan-hydrochlorothiazide (DIOVAN-HCT) 80-12.5 MG tablet  Take 1 tablet by mouth daily.   [DISCONTINUED] promethazine-dextromethorphan (PROMETHAZINE-DM) 6.25-15 MG/5ML syrup Take 5 mLs by mouth 4 (four) times daily as needed for cough.   No facility-administered encounter medications on file as of 07/29/2023.    Past Medical History:  Diagnosis Date   Allergy    Diverticulitis    GERD (gastroesophageal reflux disease)    Hyperlipidemia    Phreesia 01/11/2020   Seasonal allergies     History reviewed. No pertinent surgical history.  Family History  Problem Relation Age of Onset   Arthritis Mother    Heart disease Mother    Hyperlipidemia Mother    Hypertension Mother    Obesity Mother    Arthritis Father    Diabetes Father    Hearing loss Father    Heart disease Father    Obesity Father    COPD Paternal Grandfather    Diabetes Paternal Grandmother    Stroke Paternal Grandmother    Varicose Veins Paternal Grandmother     Social History   Socioeconomic History   Marital status: Married    Spouse name: Not on file   Number of children: Not on file   Years of education: Not on file   Highest education level: 12th grade  Occupational History   Not on file  Tobacco Use   Smoking status: Never   Smokeless tobacco: Never  Vaping Use   Vaping status: Never Used  Substance and  Sexual Activity   Alcohol use: Yes    Alcohol/week: 2.0 standard drinks of alcohol    Types: 2 Cans of beer per week    Comment: Few times a month   Drug use: No   Sexual activity: Yes    Birth control/protection: None  Other Topics Concern   Not on file  Social History Narrative   Not on file   Social Drivers of Health   Financial Resource Strain: Low Risk  (07/29/2023)   Overall Financial Resource Strain (CARDIA)    Difficulty of Paying Living Expenses: Not hard at all  Food Insecurity: No Food Insecurity (07/29/2023)   Hunger Vital Sign    Worried About Running Out of Food in the Last Year: Never true    Ran Out of Food in the Last Year:  Never true  Transportation Needs: No Transportation Needs (07/29/2023)   PRAPARE - Administrator, Civil Service (Medical): No    Lack of Transportation (Non-Medical): No  Physical Activity: Unknown (07/29/2023)   Exercise Vital Sign    Days of Exercise per Week: 0 days    Minutes of Exercise per Session: Not on file  Stress: No Stress Concern Present (07/29/2023)   [employer] of Occupational Health - Occupational Stress Questionnaire    Feeling of Stress : Not at all  Social Connections: Moderately Integrated (07/29/2023)   Social Connection and Isolation Panel [NHANES]    Frequency of Communication with Friends and Family: More than three times a week    Frequency of Social Gatherings with Friends and Family: Once a week    Attends Religious Services: 1 to 4 times per year    Active Member of [employer] or Organizations: No    Attends Engineer, structural: Not on file    Marital Status: Married  Catering manager Violence: Not on file    ROS     Objective   BP (!) 159/124   Pulse 80   Ht 5\' 11"  (1.803 m)   Wt 224 lb 1.9 oz (101.7 kg)   SpO2 97%   BMI 31.26 kg/m   Physical Exam General: Alert, oriented HEENT: PERRLA, EOMI, moist mucosa CV: Regular rate rhythm Pulmonary: Lungs clear bilaterally GI: Soft, normal bowel sounds MSK: Strength is bilaterally Extremities: No pedal edema. Skin: Erythematous macular rash on the lower extremities, more so on the right leg than left.     Assessment & Plan:   Encounter to establish care  Eczema, unspecified type Assessment & Plan: Macular rash on the legs bilaterally.  Prescribing triamcinolone cream  Orders: -     CBC  Primary hypertension Assessment & Plan: Significantly elevated on both readings.  Undiagnosed sleep apnea likely contributing.  Starting valsartan-HCTZ 80-12.5 due to the severity of his blood pressure.  Advised him to check it twice a day and bring back the results the next time he  sees [place] in 1 month.  Orders: -     Comprehensive metabolic panel with GFR -     CBC  Hyperlipidemia, unspecified hyperlipidemia type Assessment & Plan: Last lipid panel 5 years ago was elevated and he has a family history of early CAD with MI in both mother and father and father's first MI being for age of 40.  Therefore getting a ApoB and LPA as well.  Orders: -     Lipid panel -     Apolipoprotein B -     Lipoprotein A (LPA)  Encounter for hepatitis C  screening test for low risk patient -     Hepatitis C antibody  Encounter for screening for HIV -     HIV Antibody (routine testing w rflx)  Obesity, unspecified class, unspecified obesity type, unspecified whether serious comorbidity present -     Comprehensive metabolic panel with GFR -     Hemoglobin A1c  Snoring -     Home sleep test  Daytime somnolence Assessment & Plan: Likely related to sleep apnea.  Ordering sleep study home test.  Also recommended taking Flonase daily for better control of his allergies  Orders: -     Home sleep test  Encounter for colorectal cancer screening -     Ambulatory referral to Gastroenterology  Family history of early CAD -     Lipoprotein A (LPA)  Other orders -     Triamcinolone Acetonide; Apply 1 Application topically 2 (two) times daily.  Dispense: 30 g; Refill: 2 -     Valsartan-hydroCHLOROthiazide; Take 1 tablet by mouth daily.  Dispense: 90 tablet; Refill: 3  I spent 45 minutes in the management of this patient  Return in about 4 weeks (around 08/26/2023) for HTN, hld.   Laneta Pintos, MD

## 2023-07-29 NOTE — Assessment & Plan Note (Signed)
 Likely related to sleep apnea.  Ordering sleep study home test.  Also recommended taking Flonase daily for better control of his allergies

## 2023-07-29 NOTE — Assessment & Plan Note (Signed)
 Last lipid panel 5 years ago was elevated and he has a family history of early CAD with MI in both mother and father and father's first MI being for age of 3.  Therefore getting a ApoB and LPA as well.

## 2023-07-29 NOTE — Patient Instructions (Addendum)
 It was nice to see you today,  We addressed the following topics today: -I am going to send in a referral for a home sleep study.  Someone will call you to set this up. - For your seasonal allergies you should take Flonase or a similar corticosteroid every day and then use things like Zyrtec or Claritin as needed or nasal sprays such as Astepro over-the-counter as needed. - Insane referral for colonoscopy - I am checking some blood tests and we will follow-up with results when I get them.   Have a great day,  Etha Henle, MD

## 2023-07-31 ENCOUNTER — Telehealth: Payer: Self-pay

## 2023-07-31 ENCOUNTER — Other Ambulatory Visit: Payer: Self-pay | Admitting: Family Medicine

## 2023-07-31 ENCOUNTER — Encounter: Payer: Self-pay | Admitting: Family Medicine

## 2023-07-31 ENCOUNTER — Other Ambulatory Visit (HOSPITAL_COMMUNITY): Payer: Self-pay

## 2023-07-31 LAB — CBC
Hematocrit: 46.2 % (ref 37.5–51.0)
Hemoglobin: 15.5 g/dL (ref 13.0–17.7)
MCH: 28 pg (ref 26.6–33.0)
MCHC: 33.5 g/dL (ref 31.5–35.7)
MCV: 84 fL (ref 79–97)
Platelets: 230 10*3/uL (ref 150–450)
RBC: 5.53 x10E6/uL (ref 4.14–5.80)
RDW: 13.5 % (ref 11.6–15.4)
WBC: 7 10*3/uL (ref 3.4–10.8)

## 2023-07-31 LAB — COMPREHENSIVE METABOLIC PANEL WITH GFR
ALT: 22 IU/L (ref 0–44)
AST: 19 IU/L (ref 0–40)
Albumin: 4.5 g/dL (ref 4.1–5.1)
Alkaline Phosphatase: 112 IU/L (ref 44–121)
BUN/Creatinine Ratio: 17 (ref 9–20)
BUN: 20 mg/dL (ref 6–24)
Bilirubin Total: 0.3 mg/dL (ref 0.0–1.2)
CO2: 24 mmol/L (ref 20–29)
Calcium: 9.6 mg/dL (ref 8.7–10.2)
Chloride: 103 mmol/L (ref 96–106)
Creatinine, Ser: 1.15 mg/dL (ref 0.76–1.27)
Globulin, Total: 2.7 g/dL (ref 1.5–4.5)
Glucose: 108 mg/dL — ABNORMAL HIGH (ref 70–99)
Potassium: 4.6 mmol/L (ref 3.5–5.2)
Sodium: 141 mmol/L (ref 134–144)
Total Protein: 7.2 g/dL (ref 6.0–8.5)
eGFR: 78 mL/min/{1.73_m2} (ref >59)

## 2023-07-31 LAB — LIPID PANEL
Chol/HDL Ratio: 6.7 ratio — ABNORMAL HIGH (ref 0.0–5.0)
Cholesterol, Total: 208 mg/dL — ABNORMAL HIGH (ref 100–199)
HDL: 31 mg/dL — ABNORMAL LOW (ref >39)
LDL Chol Calc (NIH): 120 mg/dL — ABNORMAL HIGH (ref 0–99)
Triglycerides: 325 mg/dL — ABNORMAL HIGH (ref 0–149)
VLDL Cholesterol Cal: 57 mg/dL — ABNORMAL HIGH (ref 5–40)

## 2023-07-31 LAB — HEMOGLOBIN A1C
Est. average glucose Bld gHb Est-mCnc: 137 mg/dL
Hgb A1c MFr Bld: 6.4 % — ABNORMAL HIGH (ref 4.8–5.6)

## 2023-07-31 LAB — APOLIPOPROTEIN B: Apolipoprotein B: 109 mg/dL — ABNORMAL HIGH (ref <90)

## 2023-07-31 LAB — HIV ANTIBODY (ROUTINE TESTING W REFLEX): HIV Screen 4th Generation wRfx: NONREACTIVE

## 2023-07-31 LAB — HEPATITIS C ANTIBODY: Hep C Virus Ab: NONREACTIVE

## 2023-07-31 LAB — LIPOPROTEIN A (LPA)

## 2023-07-31 MED ORDER — ROSUVASTATIN CALCIUM 5 MG PO TABS
5.0000 mg | ORAL_TABLET | Freq: Every day | ORAL | 3 refills | Status: DC
  Filled 2023-07-31: qty 90, 90d supply, fill #0
  Filled 2023-10-28: qty 90, 90d supply, fill #1

## 2023-07-31 NOTE — Telephone Encounter (Signed)
 Copied from CRM [id number]. Topic: Clinical - Lab/Test Results >> Jul 31, 2023 10:24 AM Antwanette L wrote: Reason for CRM: Patient wife has  questions about lab results. Please have Cyril Woodmansee to contact [facility] at [phone]

## 2023-08-06 ENCOUNTER — Encounter: Payer: Self-pay | Admitting: Family Medicine

## 2023-08-07 ENCOUNTER — Other Ambulatory Visit: Payer: Self-pay | Admitting: Family Medicine

## 2023-08-07 ENCOUNTER — Other Ambulatory Visit (HOSPITAL_COMMUNITY): Payer: Self-pay

## 2023-08-07 MED ORDER — VALSARTAN 80 MG PO TABS
80.0000 mg | ORAL_TABLET | Freq: Every day | ORAL | 3 refills | Status: DC
Start: 2023-08-07 — End: 2023-12-02
  Filled 2023-09-03: qty 90, 90d supply, fill #0
  Filled 2023-11-27 (×2): qty 90, 90d supply, fill #1

## 2023-08-07 NOTE — Telephone Encounter (Signed)
 LVM for pt to call back to get schedule to come in for an EKG.  Please schedule a NURSE VISIT

## 2023-08-09 ENCOUNTER — Ambulatory Visit (INDEPENDENT_AMBULATORY_CARE_PROVIDER_SITE_OTHER)

## 2023-08-09 ENCOUNTER — Ambulatory Visit: Attending: Family Medicine

## 2023-08-09 VITALS — BP 128/81 | HR 118 | Ht 71.0 in | Wt 222.2 lb

## 2023-08-09 DIAGNOSIS — R000 Tachycardia, unspecified: Secondary | ICD-10-CM

## 2023-08-09 DIAGNOSIS — I517 Cardiomegaly: Secondary | ICD-10-CM | POA: Diagnosis not present

## 2023-08-09 NOTE — Progress Notes (Signed)
 I spoke with the patient and his wife after their EKG regarding the results of the EKG.  Showed sinus tachycardia with biatrial enlargement.  Patient just started taking the valsartan  without hydrochlorothiazide  today.  They are okay with ordering an echocardiogram and a Zio patch before deciding if a cardiologist referral is needed.  They have follow-up with me in approximately 3 weeks.

## 2023-08-09 NOTE — Addendum Note (Signed)
 Addended by: Laneta Pintos on: 08/09/2023 10:35 AM   Modules accepted: Orders

## 2023-08-09 NOTE — Progress Notes (Signed)
 Pt in office for EKG due to tachycardia. EKG performed and provider given results.

## 2023-08-09 NOTE — Progress Notes (Unsigned)
 EP to read.

## 2023-08-15 ENCOUNTER — Encounter: Payer: Self-pay | Admitting: Family Medicine

## 2023-08-20 ENCOUNTER — Other Ambulatory Visit: Payer: Self-pay | Admitting: Family Medicine

## 2023-08-20 DIAGNOSIS — I517 Cardiomegaly: Secondary | ICD-10-CM

## 2023-08-20 DIAGNOSIS — Z8249 Family history of ischemic heart disease and other diseases of the circulatory system: Secondary | ICD-10-CM

## 2023-08-20 DIAGNOSIS — R000 Tachycardia, unspecified: Secondary | ICD-10-CM

## 2023-08-29 ENCOUNTER — Ambulatory Visit: Admitting: Family Medicine

## 2023-08-29 ENCOUNTER — Encounter: Payer: Self-pay | Admitting: Family Medicine

## 2023-08-29 VITALS — BP 124/76 | HR 118 | Ht 71.0 in | Wt 219.1 lb

## 2023-08-29 DIAGNOSIS — L308 Other specified dermatitis: Secondary | ICD-10-CM | POA: Diagnosis not present

## 2023-08-29 DIAGNOSIS — E782 Mixed hyperlipidemia: Secondary | ICD-10-CM

## 2023-08-29 DIAGNOSIS — I10 Essential (primary) hypertension: Secondary | ICD-10-CM | POA: Diagnosis not present

## 2023-08-29 NOTE — Progress Notes (Signed)
   Established Patient Office Visit  Subjective   Patient ID: Donald Crawford, male    DOB: [birth year]  Age: 48 y.o. MRN: [id number]  Chief Complaint  Patient presents with   Medical Management of Chronic Issues    HPI  Subjective - Hypertension: Blood pressure has been good, better than when first presented - Heart rate has been better since stopping hydrochlorothiazide  - No more spikes and drops in blood pressure - Tolerating valsartan  alone well - Rash on legs improving with triamcinolone  cream - Started Crestor  approximately 1 month ago - Scheduled to see cardiologist Dr. Gollan on 10/08/2023 - Echo scheduled for 09/25/2023 - still wearing zio patch - Reports occasional symptoms with exertion (running upstairs, working outside, inadequate hydration) - Reports sleeping better since blood pressure improved   - Occasionally has low-fat cheese as snack - Reports changing from regular rice to ground rice  Medications Valsartan  (hypertension), Crestor  (started approximately 1 month ago for hyperlipidemia), triamcinolone  cream (skin rash)  PMH, PSH, FH, Social Hx PMHx: Hypertension, hyperlipidemia, pre-diabetes (A1c 6.4), skin rash, possible sleep disorder  ROS Cardiovascular: Denies significant palpitations, occasional symptoms with exertion Skin: Improving rash on legs Sleep: Improved sleep since blood pressure better controlled   The 10-year ASCVD risk score (Arnett DK, et al., 2019) is: 6.7%  Health Maintenance Due  Topic Date Due   Colonoscopy  Never done   COVID-19 Vaccine (1 - 2024-25 season) Never done      Objective:     BP 124/76   Pulse (!) 118   Ht 5\' 11"  (1.803 m)   Wt 219 lb 1.9 oz (99.4 kg)   SpO2 96%   BMI 30.56 kg/m    Physical Exam Gen: alert, oriented Cv: rrr, no murmur Pulm: lctab   No results found for any visits on 08/29/23.      Assessment & Plan:   Mixed hyperlipidemia Assessment & Plan: Taking Crestor .  No side effects.   Agreeable to rechecking today.  Started approximately 1 month ago.  Orders: -     Lipid panel; Future  Primary hypertension Assessment & Plan: Blood pressure has been much better on the valsartan .  Home readings show systolic numbers mostly under 409, diastolic numbers mostly in the 80s with some in the low 90s. - Will hold off on further titration at this time.  Continue current dose of valsartan . - Did not tolerate hydrochlorothiazide  due to tachycardia.   Other eczema Assessment & Plan: Improved rash with triamcinolone  use.      Return in about 3 months (around 11/29/2023) for prediabetes, hld.    Laneta Pintos, MD

## 2023-08-29 NOTE — Assessment & Plan Note (Signed)
 Blood pressure has been much better on the valsartan .  Home readings show systolic numbers mostly under 829, diastolic numbers mostly in the 80s with some in the low 90s. - Will hold off on further titration at this time.  Continue current dose of valsartan . - Did not tolerate hydrochlorothiazide  due to tachycardia.

## 2023-08-29 NOTE — Assessment & Plan Note (Signed)
 Improved rash with triamcinolone  use.

## 2023-08-29 NOTE — Patient Instructions (Signed)
 It was nice to see you today,  We addressed the following topics today: -Your home blood pressure readings are good.  No changes to your medication. - I would like to recheck your cholesterol level in about 2 weeks. - I will make sure that the sleep study was sent in but you can schedule this at your convenience.  Have a great day,  Etha Henle, MD

## 2023-08-29 NOTE — Assessment & Plan Note (Signed)
 Taking Crestor .  No side effects.  Agreeable to rechecking today.  Started approximately 1 month ago.

## 2023-09-03 ENCOUNTER — Other Ambulatory Visit (HOSPITAL_COMMUNITY): Payer: Self-pay

## 2023-09-03 ENCOUNTER — Other Ambulatory Visit: Payer: Self-pay

## 2023-09-06 ENCOUNTER — Other Ambulatory Visit

## 2023-09-06 DIAGNOSIS — E782 Mixed hyperlipidemia: Secondary | ICD-10-CM

## 2023-09-07 LAB — LIPID PANEL
Chol/HDL Ratio: 6.1 ratio — ABNORMAL HIGH (ref 0.0–5.0)
Cholesterol, Total: 164 mg/dL (ref 100–199)
HDL: 27 mg/dL — ABNORMAL LOW (ref >39)
LDL Chol Calc (NIH): 85 mg/dL (ref 0–99)
Triglycerides: 315 mg/dL — ABNORMAL HIGH (ref 0–149)
VLDL Cholesterol Cal: 52 mg/dL — ABNORMAL HIGH (ref 5–40)

## 2023-09-11 ENCOUNTER — Ambulatory Visit: Payer: Self-pay | Admitting: Family Medicine

## 2023-09-13 DIAGNOSIS — R000 Tachycardia, unspecified: Secondary | ICD-10-CM | POA: Diagnosis not present

## 2023-09-13 DIAGNOSIS — I517 Cardiomegaly: Secondary | ICD-10-CM | POA: Diagnosis not present

## 2023-09-14 DIAGNOSIS — R000 Tachycardia, unspecified: Secondary | ICD-10-CM | POA: Diagnosis not present

## 2023-09-15 ENCOUNTER — Ambulatory Visit: Payer: Self-pay | Admitting: Family Medicine

## 2023-09-16 ENCOUNTER — Other Ambulatory Visit (HOSPITAL_BASED_OUTPATIENT_CLINIC_OR_DEPARTMENT_OTHER)

## 2023-09-25 ENCOUNTER — Ambulatory Visit (HOSPITAL_COMMUNITY)
Admission: RE | Admit: 2023-09-25 | Discharge: 2023-09-25 | Disposition: A | Discharge status: Home / Self Care | Source: Ambulatory Visit | Attending: Vascular Surgery | Admitting: Vascular Surgery

## 2023-09-25 DIAGNOSIS — I517 Cardiomegaly: Secondary | ICD-10-CM | POA: Diagnosis not present

## 2023-09-25 DIAGNOSIS — R000 Tachycardia, unspecified: Secondary | ICD-10-CM | POA: Diagnosis not present

## 2023-09-25 DIAGNOSIS — R9431 Abnormal electrocardiogram [ECG] [EKG]: Secondary | ICD-10-CM | POA: Diagnosis not present

## 2023-09-26 LAB — ECHOCARDIOGRAM COMPLETE
AR max vel: 3.1 cm2
AV Area VTI: 2.92 cm2
AV Area mean vel: 2.91 cm2
AV Mean grad: 3 mmHg
AV Peak grad: 6.3 mmHg
Ao pk vel: 1.25 m/s
Area-P 1/2: 3.6 cm2
S' Lateral: 3.74 cm

## 2023-10-07 NOTE — Progress Notes (Unsigned)
 Cardiology Office Note  Date:  10/08/2023   ID:  Donald Crawford, DOB [birth year], MRN [id number]  PCP:  Chandra Toribio POUR, MD   Chief Complaint  Patient presents with   Tachycardia    Referred for tachycardia and family hx of CAD. Patient experiences shortness of breath on occasion. Meds reviewed.     HPI:  Donald Crawford is a 48 year old gentleman with past medical history of Hypertension hyperlipidemia Family history coronary disease, mom and dad Who presents by referral from Dr. Toribio Chandra for consultation of his tachycardia, essential hypertension, family history cardiac disease  Echocardiogram September 25, 2023 EF 50 to 55%, no valve disease Atria normal size  Event monitor HR 51 - 185, average 95 bpm. 3 nonsustained SVT, longest 6 beats. Rare supraventricular and ventricular ectopy. No sustained arrhythmias. No atrial fibrillation.   Lab work reviewed Total cholesterol 232, repeat 209, most recently 164 A1c 6.4  Tachycardia, hydrochlorothiazide  stopped  Stop bang score 6, moderate to severe risk of OSA Tired in the daytime, apnea at night, snoring at night per patient's wife  EKG with primary care August 09, 2023 Sinus tachycardia rate 113 bpm no significant ST-T wave changes  EKG personally reviewed by myself on todays visit EKG Interpretation Date/Time:  Tuesday October 08 2023 14:40:22 EDT Ventricular Rate:  96 PR Interval:  140 QRS Duration:  82 QT Interval:  344 QTC Calculation: 434 R Axis:   0  Text Interpretation: Normal sinus rhythm Normal ECG No previous ECGs available Confirmed by Perla Lye [phone]) on 10/08/2023 2:41:59 PM    PMH:   has a past medical history of Allergy, Diverticulitis, GERD (gastroesophageal reflux disease), Hyperlipidemia, and Seasonal allergies.  PSH:   History reviewed. No pertinent surgical history.  Current Outpatient Medications  Medication Sig Dispense Refill   ibuprofen (ADVIL,MOTRIN) 400 MG tablet Take 400 mg by  mouth every 6 (six) hours as needed for mild pain.     rosuvastatin  (CRESTOR ) 5 MG tablet Take 1 tablet (5 mg total) by mouth daily. 90 tablet 3   terbinafine  (LAMISIL  AT) 1 % cream Apply 1 Application topically 2 (two) times daily. 30 g 0   triamcinolone  cream (KENALOG ) 0.1 % Apply 1 Application topically 2 (two) times daily. 30 g 2   valsartan  (DIOVAN ) 80 MG tablet Take 1 tablet (80 mg total) by mouth daily. 90 tablet 3   No current facility-administered medications for this visit.     Allergies:   Amoxicillin    Social History:  The patient  reports that he has never smoked. He has never used smokeless tobacco. He reports current alcohol use of about 2.0 standard drinks of alcohol per week. He reports that he does not use drugs.   Family History:   family history includes Arthritis in his father and mother; COPD in his paternal grandfather; Diabetes in his father and paternal grandmother; Hearing loss in his father; Heart disease in his father and mother; Hyperlipidemia in his mother; Hypertension in his mother; Obesity in his father and mother; Stroke in his paternal grandmother; Varicose Veins in his paternal grandmother.    Review of Systems: Review of Systems  Constitutional: Negative.   HENT: Negative.    Respiratory: Negative.    Cardiovascular: Negative.   Gastrointestinal: Negative.   Musculoskeletal: Negative.   Neurological: Negative.   Psychiatric/Behavioral: Negative.    All other systems reviewed and are negative.   PHYSICAL EXAM: VS:  Ht 5' 11 (1.803 m)   Wt 218 lb 12.8  oz (99.2 kg)   BMI 30.52 kg/m  , BMI Body mass index is 30.52 kg/m. GEN: Well nourished, well developed, in no acute distress HEENT: normal Neck: no JVD, carotid bruits, or masses Cardiac: RRR; no murmurs, rubs, or gallops,no edema  Respiratory:  clear to auscultation bilaterally, normal work of breathing GI: soft, nontender, nondistended, + BS MS: no deformity or atrophy Skin: warm and dry,  no rash Neuro:  Strength and sensation are intact Psych: euthymic mood, full affect    Recent Labs: 07/29/2023: ALT 22; BUN 20; Creatinine, Ser 1.15; Hemoglobin 15.5; Platelets 230; Potassium 4.6; Sodium 141    Lipid Panel Lab Results  Component Value Date   CHOL 164 09/06/2023   HDL 27 (L) 09/06/2023   LDLCALC 85 09/06/2023   TRIG 315 (H) 09/06/2023      Wt Readings from Last 3 Encounters:  10/08/23 218 lb 12.8 oz (99.2 kg)  08/29/23 219 lb 1.9 oz (99.4 kg)  08/09/23 222 lb 4 oz (100.8 kg)       ASSESSMENT AND PLAN:  Problem List Items Addressed This Visit     Tachycardia - Primary   Relevant Orders   EKG 12-Lead (Completed)   Tachycardia Does not appear to have inappropriate tachycardia on review of event monitor from May 2025 - Beta-blocker offered including propranolol as needed, metoprolol as needed for symptomatic palpitations - He denies having symptomatic tachycardia - Recommend regular exercise program for conditioning - Stress may be contributing Wife who is a nurse will continue to monitor heart rate at home and call us  if it seems inappropriate at which time propranolol or metoprolol could be used -No structural reasons for elevated heart rate, low normal ejection fraction 50 to 55% On my review of images EF closer to 55  Possible sleep apnea STOP-BANG questionnaire of 6, moderate to high risk of sleep apnea, family history of sleep apnea Order placed for Watch PAT system  Essential hypertension Blood pressure well-controlled on valsartan  alone In the future if alternate medication desired, could try medication such as carvedilol or bystolic which would manage blood pressure and heart rate  Hyperlipidemia On statin Consider CT coronary calcium  scoring for risk stratification if desired  Patient seen in consultation for Dr. Chandra and will be referred back to his office for ongoing care of the issues detailed above  Signed, Velinda Lunger, M.D.,  Ph.D. [facility] [place], [place] [phone]

## 2023-10-08 ENCOUNTER — Ambulatory Visit: Attending: Cardiovascular Disease | Admitting: Cardiovascular Disease

## 2023-10-08 ENCOUNTER — Encounter: Payer: Self-pay | Admitting: Cardiovascular Disease

## 2023-10-08 VITALS — BP 122/90 | HR 96 | Ht 71.0 in | Wt 218.8 lb

## 2023-10-08 DIAGNOSIS — R0683 Snoring: Secondary | ICD-10-CM | POA: Diagnosis not present

## 2023-10-08 DIAGNOSIS — E669 Obesity, unspecified: Secondary | ICD-10-CM | POA: Diagnosis not present

## 2023-10-08 DIAGNOSIS — R000 Tachycardia, unspecified: Secondary | ICD-10-CM

## 2023-10-08 NOTE — Patient Instructions (Addendum)
 Sleep study, watch PAT  Call if you would like a CT coronary calcium  score  Medication Instructions:  No changes  If you need a refill on your cardiac medications before your next appointment, please call your pharmacy.   Lab work: No new labs needed  Testing/Procedures: WatchPAT?  Is a FDA cleared portable home sleep study test that uses a watch and 3 points of contact to monitor 7 different channels, including your heart rate, oxygen saturations, body position, snoring, and chest motion.  The study is easy to use from the comfort of your own home and accurately detect sleep apnea.  Before bed, you attach the chest sensor, attached the sleep apnea bracelet to your nondominant hand, and attach the finger probe.  After the study, the raw data is downloaded from the watch and scored for apnea events.   For more information: [email or web address]  Patient Testing Instructions:  Do not put battery into the device until bedtime when you are ready to begin the test. Please call the support number if you need assistance after following the instructions below: 24 hour support line- [phone] or [facility] support at [phone] (option 2)  Download the [facility] WatchPAT One app through the google play store or App Store  Be sure to turn on or enable access to bluetooth in settlings on your smartphone/ device  Make sure no other bluetooth devices are on and within the vicinity of your smartphone/ device and WatchPAT watch during testing.  Make sure to leave your smart phone/ device plugged in and charging all night.  When ready for bed:  Follow the instructions step by step in the WatchPAT One App to activate the testing device. For additional instructions, including video instruction, visit the WatchPAT One video on Youtube. You can search for WatchPat One within Youtube (video is 4 minutes and 18 seconds) or enter: https://youtube/[email or web address]?[email or web address]_vbiwxE Please note: You will be  prompted to enter a Pin to connect via bluetooth when starting the test. The PIN will be assigned to you when you receive the test.  The device is disposable, but it recommended that you retain the device until you receive a call letting you know the study has been received and the results have been interpreted.  We will let you know if the study did not transmit to us  properly after the test is completed. You do not need to call us  to confirm the receipt of the test.  Please complete the test within 48 hours of receiving PIN.   Frequently Asked Questions:  What is Watch Bruna one?  A single use fully disposable home sleep apnea testing device and will not need to be returned after completion.  What are the requirements to use WatchPAT one?  The be able to have a successful watchpat one sleep study, you should have your Watch pat one device, your smart phone, watch pat one app, your PIN number and Internet access What type of phone do I need?  You should have a smart phone that uses Android 5.1 and above or any Iphone with IOS 10 and above How can I download the WatchPAT one app?  Based on your device type search for WatchPAT one app either in google play for android devices or APP store for Iphone's Where will I get my PIN for the study?  Your PIN will be provided by your physician's office. It is used for authentication and if you lose/forget your PIN, please reach out to your providers office.  I do  not have Internet at home. Can I do WatchPAT one study?  WatchPAT One needs Internet connection throughout the night to be able to transmit the sleep data. You can use your home/local internet or your cellular's data package. However, it is always recommended to use home/local Internet. It is estimated that between 20MB-30MB will be used with each study.However, the application will be looking for space in the phone to start the study.  What happens if I lose internet or bluetooth connection?   During the internet disconnection, your phone will not be able to transmit the sleep data. All the data, will be stored in your phone. As soon as the internet connection is back on, the phone will being sending the sleep data. During the bluetooth disconnection, WatchPAT one will not be able to to send the sleep data to your phone. Data will be kept in the WatchPAT one until two devices have bluetooth connection back on. As soon as the connection is back on, WatchPAT one will send the sleep data to the phone.  How long do I need to wear the WatchPAT one?  After you start the study, you should wear the device at least 6 hours.  How far should I keep my phone from the device?  During the night, your phone should be within 15 feet.  What happens if I leave the room for restroom or other reasons?  Leaving the room for any reason will not cause any problem. As soon as your get back to the room, both devices will reconnect and will continue to send the sleep data. Can I use my phone during the sleep study?  Yes, you can use your phone as usual during the study. But it is recommended to put your watchpat one on when you are ready to go to bed.  How will I get my study results?  A soon as you completed your study, your sleep data will be sent to the provider. They will then share the results with you when they are ready.    Follow-Up: At [facility], you and your health needs are our priority.  As part of our continuing mission to provide you with exceptional heart care, we have created designated Provider Care Teams.  These Care Teams include your primary Cardiologist (physician) and Advanced Practice Providers (APPs -  Physician Assistants and Nurse Practitioners) who all work together to provide you with the care you need, when you need it.  You will need a follow up appointment as needed  Providers on your designated Care Team:   Lonni Meager, NP Bernardino Bring, PA-C Cadence [place]hester,  [place]  COVID-19 Vaccine Information can be found at: [email or web address] For questions related to vaccine distribution or appointments, please email [email or web address] or call [phone].

## 2023-10-09 ENCOUNTER — Telehealth: Payer: Self-pay | Admitting: Emergency Medicine

## 2023-10-09 NOTE — Telephone Encounter (Signed)
 Patient agreement will need to be signed when pick up WatchPat. Provide a copy to the patient and send the original to HIM for scanning. STOP-BANG score documented in patient's chart (can document in an existing OV encounter if captured at time of visit, or add STOPBANG SmartPhrase to this encounter).  Ensure [facility] order [phone]) is entered as future and signed. Route encounter to CV DIV SLEEP STUDIES pool.  Instructions for Sleep Team: Obtain prior authorization (use BURLPAWATCHPAT SmartPhrase) and route encounter back to covering team member. Covering team member is responsible for calling patient to notify of PA approval and PIN, and to arrange for device pickup and education.

## 2023-10-10 NOTE — Telephone Encounter (Signed)
**Note De-Identified Leshae Mcclay Obfuscation** Ordering provider: Dr Gollen Associated diagnoses: Snoring-R06.83  WatchPAT PA obtained on 10/10/2023 by Marchelle Rinella, Avelina HERO, LPN. Authorization: Per the [employer] website: If you're a participating provider, no precertification is required when this service is performed as an outpatient procedure for a medical or surgical diagnosis.  Phone note routed to covering staff for follow-up.

## 2023-10-11 NOTE — Telephone Encounter (Signed)
 Called patient and left message for call back.

## 2023-10-23 ENCOUNTER — Telehealth: Payer: Self-pay

## 2023-10-23 NOTE — Telephone Encounter (Signed)
 Patient's wife came in and picked up [facility] Sleep Test. Patient education given. All questions were answered and patient' wife verbalized understanding.  Ordering provider: Perla Associated diagnoses: Snoring, Obesity WatchPAT PA obtained on 10/23/2023 by Connell JAYSON Boers, LPN. Authorization: Yes; tracking ID No PA Required Patient notified of PIN (1234) on 10/23/2023 via Notification Method: in person.

## 2023-10-23 NOTE — Telephone Encounter (Signed)
-----   Message from Nurse Damien DEL sent at 10/12/2023  8:02 AM EDT ----- Donald Crawford (and Olam!),  I'm looping in Donald Crawford so that she can call the patient and set up a time for him to come to the [place] office to sign the contract, receive education, and pickup a device.  Donald Crawford, please have the patient sign the Patient Agreement Museum/gallery exhibitions officer) form that is in the Clinical Forms folder on SharePoint.  Thanks!  Damien ----- Message ----- From: Cristopher Crawford PARAS, RN Sent: 10/11/2023   7:32 AM EDT To: Olam CINDERELLA Bunker, RN; Damien JAYSON Maid, RN  Good Morning,  I received a message that the WatchPat has been approved. I was just checking to see if he would be able to pick it up at the [place] office? The patient works in [place] and had requested to pick it up there. ----- Message ----- From: Bunker Olam CINDERELLA, RN Sent: 10/09/2023   1:40 PM EDT To: Damien JAYSON Maid, RN; Crawford PARAS Cristopher, RN  Donald Damien!  We ordered a WatchPat for this patient. If approved, he would like to pick it up in [place] but he would also need to sign the contract. I had not updated Crawford yet on the process so she did not know he needed to sign it first.   Could he pick it up in [place] and sign the [employer] then? If not, I totally understand.  Thanks, Olam

## 2023-10-23 NOTE — Telephone Encounter (Signed)
 Notified patient that he does not need a PA for his [facility] Home Sleep Study. Patient's wife will come in to pick up device this week. All questions were answered and patient verbalized understanding.

## 2023-11-08 NOTE — Telephone Encounter (Signed)
 Called patient and left message for call back.

## 2023-11-15 NOTE — Telephone Encounter (Signed)
 Called patient and left message for call back.

## 2023-11-24 ENCOUNTER — Encounter (INDEPENDENT_AMBULATORY_CARE_PROVIDER_SITE_OTHER): Payer: Self-pay | Admitting: Cardiology

## 2023-11-24 DIAGNOSIS — G4733 Obstructive sleep apnea (adult) (pediatric): Secondary | ICD-10-CM | POA: Diagnosis not present

## 2023-11-25 ENCOUNTER — Ambulatory Visit: Attending: Cardiovascular Disease

## 2023-11-25 DIAGNOSIS — E669 Obesity, unspecified: Secondary | ICD-10-CM

## 2023-11-25 DIAGNOSIS — R0683 Snoring: Secondary | ICD-10-CM

## 2023-11-25 NOTE — Procedures (Signed)
   SLEEP STUDY REPORT Patient Information Study Date: 11/24/2023 Patient Name: Donald Crawford Patient ID: [id number] Birth Date: 1975-06-28 Age: 48 Gender: Male BMI: 30.6 (W=218 lb, H=5' 11'') Referring Physician: Evalene Lunger, MD  TEST DESCRIPTION: Home sleep apnea testing was completed using the WatchPat, a Type 1 device, utilizing peripheral arterial tonometry (PAT), chest movement, actigraphy, pulse oximetry, pulse rate, body position and snore. AHI was calculated with apnea and hypopnea using valid sleep time as the denominator. RDI includes apneas, hypopneas, and RERAs. The data acquired and the scoring of sleep and all associated events were performed in accordance with the recommended standards and specifications as outlined in the AASM Manual for the Scoring of Sleep and Associated Events 2.2.0 (2015).  FINDINGS:  1. Severe Obstructive Sleep Apnea with AHI 61.5/hr.  2. None Central Sleep Apnea with pAHIc 2.6/hr.  3. Oxygen desaturations as low as 69%.  4. Severe snoring was present. O2 sats were < 88% for 155 min.  5. Total sleep time was 6 hrs and 29 min.  6. 23.6 % of total sleep time was spent in REM sleep.  7. sleep onset latency at 6 min.  8. REM sleep onset latency at 46 min.  9. Total awakenings were 3. 10. Arrhythmia detection: None  DIAGNOSIS: Severe Obstructive Sleep Apnea (G47.33) Nocturnal Hypoxemia  RECOMMENDATIONS: 1. Clinical correlation of these findings is necessary. The decision to treat obstructive sleep apnea (OSA) is usually based on the presence of apnea symptoms or the presence of associated medical conditions such as Hypertension, Congestive Heart Failure, Atrial Fibrillation or Obesity. The most common symptoms of OSA are snoring, gasping for breath while sleeping, daytime sleepiness and fatigue. 2. Initiating apnea therapy is recommended given the presence of symptoms and/or associated conditions. Recommend proceeding with one of the  following:  a. Auto-CPAP therapy with a pressure range of 5-20cm H2O.  b. An oral appliance (OA) that can be obtained from certain dentists with expertise in sleep medicine. These are primarily of use in non-obese patients with mild and moderate disease.  c. An ENT consultation which may be useful to look for specific causes of obstruction and possible treatment options.  d. If patient is intolerant to PAP therapy, consider referral to ENT for evaluation for hypoglossal nerve stimulator. 3. Close follow-up is necessary to ensure success with CPAP or oral appliance therapy for maximum benefit . 4. A follow-up oximetry study on CPAP is recommended to assess the adequacy of therapy and determine the need for supplemental oxygen or the potential need for Bi-level therapy. An arterial blood gas to determine the adequacy of baseline ventilation and oxygenation should also be considered. 5. Healthy sleep recommendations include: adequate nightly sleep (normal 7-9 hrs/night), avoidance of caffeine after noon and alcohol near bedtime, and maintaining a sleep environment that is cool, dark and quiet. 6. Weight loss for overweight patients is recommended. Even modest amounts of weight loss can significantly improve the severity of sleep apnea. 7. Snoring recommendations include: weight loss where appropriate, side sleeping, and avoidance of alcohol before bed. 8. Operation of motor vehicle should be avoided when sleepy.  Signature: Wilbert Bihari, MD; [facility]; Diplomat, American Board of Sleep Medicine Electronically Signed: 11/25/2023 1:47:41 PM 11/24/2023,[id number],May 04, 1975,Male Page 2 of 5 Rev. 351 Printed

## 2023-12-02 ENCOUNTER — Ambulatory Visit: Admitting: Family Medicine

## 2023-12-02 ENCOUNTER — Encounter: Payer: Self-pay | Admitting: Family Medicine

## 2023-12-02 ENCOUNTER — Other Ambulatory Visit (HOSPITAL_COMMUNITY): Payer: Self-pay

## 2023-12-02 VITALS — BP 102/66 | HR 87 | Ht 71.0 in | Wt 221.2 lb

## 2023-12-02 DIAGNOSIS — I10 Essential (primary) hypertension: Secondary | ICD-10-CM

## 2023-12-02 DIAGNOSIS — G4733 Obstructive sleep apnea (adult) (pediatric): Secondary | ICD-10-CM | POA: Diagnosis not present

## 2023-12-02 MED ORDER — VALSARTAN 80 MG PO TABS
80.0000 mg | ORAL_TABLET | Freq: Every day | ORAL | 3 refills | Status: AC
  Filled 2024-02-24: qty 90, 90d supply, fill #0
  Filled 2024-05-14: qty 90, 90d supply, fill #1

## 2023-12-02 MED ORDER — ROSUVASTATIN CALCIUM 5 MG PO TABS
5.0000 mg | ORAL_TABLET | Freq: Every day | ORAL | 3 refills | Status: AC
  Filled 2023-12-02 – 2024-01-25 (×2): qty 90, 90d supply, fill #0
  Filled 2024-05-14: qty 90, 90d supply, fill #1

## 2023-12-02 NOTE — Progress Notes (Signed)
   Established Patient Office Visit  Subjective   Patient ID: Donald Crawford, male    DOB: [birth year]  Age: 48 y.o. MRN: [id number]  Chief Complaint  Patient presents with   Hyperlipidemia    HPI  Subjective -   Severe obstructive sleep apnea, follow-up on recent diagnosis via home sleep study. Discussed treatment options. Reports not being a still sleeper and is concerned about tolerating a CPAP mask. -   Hypertension, stable. Reports no recent palpitations. -   Leg rash, improved. Using triamcinolone  cream as needed for flares.  Medications Currently taking Crestor , valsartan , and Flonase . Uses triamcinolone  cream PRN for a leg rash. Tolerating all medications without issue. The cardiologist had discussed propranolol for palpitations, but it was not prescribed as blood pressure is running lower and palpitations resolved after stopping hydrocortisone.  PMH, PSH, FH, Social Hx PMH: Hypertension, hyperlipidemia, allergies, asthma, leg rash. FH: Polyps, perforated colon.  ROS Cardiovascular: Denies recent palpitations. Constitutional: Denies issues with current medications. Respiratory: Reports baseline congestion.   The 10-year ASCVD risk score (Arnett DK, et al., 2019) is: 3.9%  Health Maintenance Due  Topic Date Due   Hepatitis B Vaccines 19-59 Average Risk (1 of 3 - 19+ 3-dose series) Never done   Colonoscopy  Never done   COVID-19 Vaccine (1 - 2024-25 season) Never done   Pneumococcal Vaccine: 50+ Years (1 of 1 - PCV) Never done   Zoster Vaccines- Shingrix (1 of 2) Never done   INFLUENZA VACCINE  11/15/2023      Objective:     BP 102/66   Pulse 87   Ht 5' 11 (1.803 m)   Wt 221 lb 4 oz (100.4 kg)   SpO2 97%   BMI 30.86 kg/m    Physical Exam General: Alert, oriented CV: Rate rhythm no murmurs Pulmonary: Clear bilaterally   No results found for any visits on 12/02/23.      Assessment & Plan:   OSA (obstructive sleep apnea) Assessment & Plan: -    Recently diagnosed with severe OSA via a home sleep study ordered by cardiology. His heart rate was noted to increase up to 150 bpm during the study, possibly related to the apnea events. Mainstay treatment with CPAP was discussed. Alternative options like a mouth guard are not recommended for severe OSA. The option of an ENT evaluation for underlying issues like a deviated septum was also discussed. He agrees to trial CPAP. -   Order CPAP machine with auto-titrating settings. Send prescription to [facility] per patient preference. -   Refills for current medications will be sent. -   Discussed ENT referral, he will consider this if needed. -   Counselled that he can message if he wants an ENT referral in the future.  Orders: -     For home use only DME continuous positive airway pressure (CPAP)  Primary hypertension Assessment & Plan: Bp at goal.  Continue valsartan    Other orders -     Rosuvastatin  Calcium ; Take 1 tablet (5 mg total) by mouth daily.  Dispense: 90 tablet; Refill: 3 -     Valsartan ; Take 1 tablet (80 mg total) by mouth daily.  Dispense: 90 tablet; Refill: 3     Return in about 6 months (around 06/03/2024) for HTN, hld.    Donald MARLA Slain, MD

## 2023-12-02 NOTE — Assessment & Plan Note (Signed)
-     Recently diagnosed with severe OSA via a home sleep study ordered by cardiology. His heart rate was noted to increase up to 150 bpm during the study, possibly related to the apnea events. Mainstay treatment with CPAP was discussed. Alternative options like a mouth guard are not recommended for severe OSA. The option of an ENT evaluation for underlying issues like a deviated septum was also discussed. He agrees to trial CPAP. -   Order CPAP machine with auto-titrating settings. Send prescription to [facility] per patient preference. -   Refills for current medications will be sent. -   Discussed ENT referral, he will consider this if needed. -   Counselled that he can message if he wants an ENT referral in the future.

## 2023-12-02 NOTE — Assessment & Plan Note (Signed)
 Bp at goal. Continue valsartan.

## 2023-12-02 NOTE — Patient Instructions (Signed)
 It was nice to see you today,  We addressed the following topics today: -I am sending in the CPAP to [facility]. - I will send in refills of your medications - If you want a referral for colonoscopy prior to the end of this year just let us  know and I will send in a referral, otherwise I will discuss it with you at your next visit - If you would like a referral to ear nose and throat regarding any additional treatment options for helping with breathing at night when he I will send that in.   Have a great day,  Rolan Slain, MD

## 2023-12-11 ENCOUNTER — Ambulatory Visit: Admitting: Cardiology

## 2023-12-13 DIAGNOSIS — G4733 Obstructive sleep apnea (adult) (pediatric): Secondary | ICD-10-CM | POA: Diagnosis not present

## 2023-12-26 ENCOUNTER — Telehealth: Payer: Self-pay | Admitting: *Deleted

## 2023-12-26 NOTE — Telephone Encounter (Signed)
The patient has been notified of the result. Left detailed message on voicemail and informed patient to call back.

## 2023-12-26 NOTE — Telephone Encounter (Signed)
-----   Message from Wilbert Bihari sent at 11/25/2023  1:48 PM EDT ----- Please let patient know that they have sleep apnea.  Recommend therapeutic CPAP titration for treatment of patient's sleep disordered breathing.

## 2024-01-10 ENCOUNTER — Encounter: Payer: Self-pay | Admitting: Family Medicine

## 2024-01-10 NOTE — Telephone Encounter (Signed)
 Patient called to say he got his results in his mychart had a visit with his PCP who made a referral to [facility] for his cpap machine. He will call us  back if his PCP does not follow his sleep therapy to schedule with Dr Shlomo for compliance follow up.

## 2024-01-13 DIAGNOSIS — G4733 Obstructive sleep apnea (adult) (pediatric): Secondary | ICD-10-CM | POA: Diagnosis not present

## 2024-01-25 ENCOUNTER — Other Ambulatory Visit (HOSPITAL_COMMUNITY): Payer: Self-pay

## 2024-01-27 ENCOUNTER — Other Ambulatory Visit (HOSPITAL_COMMUNITY): Payer: Self-pay

## 2024-01-31 ENCOUNTER — Other Ambulatory Visit (HOSPITAL_COMMUNITY): Payer: Self-pay

## 2024-01-31 ENCOUNTER — Encounter (HOSPITAL_COMMUNITY): Payer: Self-pay

## 2024-02-15 DIAGNOSIS — R7881 Bacteremia: Secondary | ICD-10-CM | POA: Diagnosis not present

## 2024-02-15 DIAGNOSIS — E8721 Acute metabolic acidosis: Secondary | ICD-10-CM | POA: Diagnosis not present

## 2024-02-15 DIAGNOSIS — N186 End stage renal disease: Secondary | ICD-10-CM | POA: Diagnosis not present

## 2024-02-15 DIAGNOSIS — Z992 Dependence on renal dialysis: Secondary | ICD-10-CM | POA: Diagnosis not present

## 2024-02-15 DIAGNOSIS — R509 Fever, unspecified: Secondary | ICD-10-CM | POA: Diagnosis not present

## 2024-02-15 DIAGNOSIS — A419 Sepsis, unspecified organism: Secondary | ICD-10-CM | POA: Diagnosis not present

## 2024-02-16 DIAGNOSIS — E8721 Acute metabolic acidosis: Secondary | ICD-10-CM | POA: Diagnosis not present

## 2024-02-16 DIAGNOSIS — N186 End stage renal disease: Secondary | ICD-10-CM | POA: Diagnosis not present

## 2024-02-16 DIAGNOSIS — A419 Sepsis, unspecified organism: Secondary | ICD-10-CM | POA: Diagnosis not present

## 2024-02-16 DIAGNOSIS — R509 Fever, unspecified: Secondary | ICD-10-CM | POA: Diagnosis not present

## 2024-02-16 DIAGNOSIS — R7881 Bacteremia: Secondary | ICD-10-CM | POA: Diagnosis not present

## 2024-02-18 DIAGNOSIS — R509 Fever, unspecified: Secondary | ICD-10-CM | POA: Diagnosis not present

## 2024-02-18 DIAGNOSIS — N186 End stage renal disease: Secondary | ICD-10-CM | POA: Diagnosis not present

## 2024-02-18 DIAGNOSIS — E8721 Acute metabolic acidosis: Secondary | ICD-10-CM | POA: Diagnosis not present

## 2024-02-18 DIAGNOSIS — A419 Sepsis, unspecified organism: Secondary | ICD-10-CM | POA: Diagnosis not present

## 2024-02-18 DIAGNOSIS — R7881 Bacteremia: Secondary | ICD-10-CM | POA: Diagnosis not present

## 2024-02-19 DIAGNOSIS — N186 End stage renal disease: Secondary | ICD-10-CM | POA: Diagnosis not present

## 2024-02-19 DIAGNOSIS — R509 Fever, unspecified: Secondary | ICD-10-CM | POA: Diagnosis not present

## 2024-02-19 DIAGNOSIS — E8721 Acute metabolic acidosis: Secondary | ICD-10-CM | POA: Diagnosis not present

## 2024-02-19 DIAGNOSIS — A419 Sepsis, unspecified organism: Secondary | ICD-10-CM | POA: Diagnosis not present

## 2024-02-19 DIAGNOSIS — R7881 Bacteremia: Secondary | ICD-10-CM | POA: Diagnosis not present

## 2024-02-20 DIAGNOSIS — R7881 Bacteremia: Secondary | ICD-10-CM | POA: Diagnosis not present

## 2024-02-20 DIAGNOSIS — A419 Sepsis, unspecified organism: Secondary | ICD-10-CM | POA: Diagnosis not present

## 2024-02-20 DIAGNOSIS — R509 Fever, unspecified: Secondary | ICD-10-CM | POA: Diagnosis not present

## 2024-02-20 DIAGNOSIS — E8721 Acute metabolic acidosis: Secondary | ICD-10-CM | POA: Diagnosis not present

## 2024-02-20 DIAGNOSIS — N186 End stage renal disease: Secondary | ICD-10-CM | POA: Diagnosis not present

## 2024-02-21 DIAGNOSIS — R7881 Bacteremia: Secondary | ICD-10-CM | POA: Diagnosis not present

## 2024-02-21 DIAGNOSIS — E8721 Acute metabolic acidosis: Secondary | ICD-10-CM | POA: Diagnosis not present

## 2024-02-21 DIAGNOSIS — N186 End stage renal disease: Secondary | ICD-10-CM | POA: Diagnosis not present

## 2024-02-21 DIAGNOSIS — A419 Sepsis, unspecified organism: Secondary | ICD-10-CM | POA: Diagnosis not present

## 2024-02-21 DIAGNOSIS — R509 Fever, unspecified: Secondary | ICD-10-CM | POA: Diagnosis not present

## 2024-02-24 ENCOUNTER — Encounter: Payer: Self-pay | Admitting: Cardiology

## 2024-02-24 ENCOUNTER — Other Ambulatory Visit (HOSPITAL_COMMUNITY): Payer: Self-pay

## 2024-03-09 NOTE — Telephone Encounter (Signed)
Upon patient request DME selection is [facility] Patient understands he will be contacted by [facility] to set up his cpap. Patient understands to call if [facility] does not contact him with new setup in a timely manner. Patient understands they will be called once confirmation has been received from [place] that they have received their new machine to schedule 10 week follow up appointment.  [facility] notified of new cpap order  Please add to airview Patient was grateful for the call and thanked me.

## 2024-03-10 ENCOUNTER — Encounter: Payer: Self-pay | Admitting: Cardiology

## 2024-03-10 ENCOUNTER — Telehealth: Payer: Self-pay

## 2024-03-10 ENCOUNTER — Telehealth: Payer: Self-pay | Admitting: *Deleted

## 2024-03-10 ENCOUNTER — Ambulatory Visit: Attending: Cardiology | Admitting: Cardiology

## 2024-03-10 VITALS — BP 136/84 | HR 91 | Ht 71.0 in | Wt 232.0 lb

## 2024-03-10 DIAGNOSIS — R0683 Snoring: Secondary | ICD-10-CM

## 2024-03-10 DIAGNOSIS — G4733 Obstructive sleep apnea (adult) (pediatric): Secondary | ICD-10-CM

## 2024-03-10 DIAGNOSIS — R000 Tachycardia, unspecified: Secondary | ICD-10-CM

## 2024-03-10 DIAGNOSIS — E669 Obesity, unspecified: Secondary | ICD-10-CM

## 2024-03-10 NOTE — Progress Notes (Signed)
 Sleep Medicine CONSULT Note    Date:  03/10/2024   ID:  [name], DOB 08/18/1975, MRN [id number]  PCP:  Chandra Toribio POUR, MD  Cardiologist: None   Chief Complaint  Patient presents with   New Patient (Initial Visit)    Obstructive sleep apnea    History of Present Illness:  Donald Crawford is a 48 y.o. male who is being seen today for the evaluation of obstructive sleep apnea at the request of Timothy Gollan, MD.  This is a 48 year old male with a history of GERD, hyperlipidemia and allergies who was seen initially by Dr. Gollan on 10/08/2023 for palpitations and an event monitor showing SVT.  At that time he complained of excessive daytime sleepiness with witnessed apneas at night and snoring per his wife.  STOP-BANG score was 6.  Home sleep study was ordered which demonstrated severe obstructive sleep apnea with an AHI of 61.5/h with no significant central events.  There was evidence of nocturnal hypoxemia with O2 saturation nadir 69%.  Apparently the patient got the results of his sleep study on MyChart and when I had made an appointment with his PCP to get referred to [facility] to get a CPAP machine ordered before we could even get him to get a CPAP titration done.  He is now referred for sleep medicine consultsultation for treatment of obstructive sleep apnea.  He is doing well with his PAP device and thinks that he has gotten used to it.  He tolerates the under the nose full face mask and feels the pressure is adequate.  Since going on PAP he feels rested in the am and has no significant daytime sleepiness.  He goes to bed at 10PM and gets up around 6am. He denies any significant mouth or nasal dryness or nasal congestion.  He still snores a little but nothing like before CPAP according to his wife. An Epworth Sleepiness Scale score was calculated the office today and this endorsed at 2 arguing against residual daytime sleepiness. Patient denies any episodes of bruxism, restless legs,  No gagging hallucinations or cataplectic events.    Past Medical History:  Diagnosis Date   Allergy    Diverticulitis    GERD (gastroesophageal reflux disease)    Hyperlipidemia    Phreesia 01/11/2020   Seasonal allergies     History reviewed. No pertinent surgical history.  Current Medications: Current Meds  Medication Sig   ibuprofen (ADVIL,MOTRIN) 400 MG tablet Take 400 mg by mouth every 6 (six) hours as needed for mild pain.   rosuvastatin  (CRESTOR ) 5 MG tablet Take 1 tablet (5 mg total) by mouth daily.   terbinafine  (LAMISIL  AT) 1 % cream Apply 1 Application topically 2 (two) times daily.   triamcinolone  cream (KENALOG ) 0.1 % Apply 1 Application topically 2 (two) times daily.   valsartan  (DIOVAN ) 80 MG tablet Take 1 tablet (80 mg total) by mouth daily.    Allergies:   Amoxicillin    Social History   Socioeconomic History   Marital status: Married    Spouse name: Not on file   Number of children: Not on file   Years of education: Not on file   Highest education level: 12th grade  Occupational History   Not on file  Tobacco Use   Smoking status: Never   Smokeless tobacco: Never  Vaping Use   Vaping status: Never Used  Substance and Sexual Activity   Alcohol use: Yes    Alcohol/week: 2.0 standard drinks of alcohol  Types: 2 Cans of beer per week    Comment: Few times a month   Drug use: No   Sexual activity: Yes    Birth control/protection: None  Other Topics Concern   Not on file  Social History Narrative   Not on file   Social Drivers of Health   Financial Resource Strain: Low Risk  (07/29/2023)   Overall Financial Resource Strain (CARDIA)    Difficulty of Paying Living Expenses: Not hard at all  Food Insecurity: No Food Insecurity (07/29/2023)   Hunger Vital Sign    Worried About Running Out of Food in the Last Year: Never true    Ran Out of Food in the Last Year: Never true  Transportation Needs: No Transportation Needs (07/29/2023)   PRAPARE -  Administrator, Civil Service (Medical): No    Lack of Transportation (Non-Medical): No  Physical Activity: Unknown (07/29/2023)   Exercise Vital Sign    Days of Exercise per Week: 0 days    Minutes of Exercise per Session: Not on file  Stress: No Stress Concern Present (07/29/2023)   [employer] of Occupational Health - Occupational Stress Questionnaire    Feeling of Stress : Not at all  Social Connections: Moderately Integrated (07/29/2023)   Social Connection and Isolation Panel    Frequency of Communication with Friends and Family: More than three times a week    Frequency of Social Gatherings with Friends and Family: Once a week    Attends Religious Services: 1 to 4 times per year    Active Member of [employer] or Organizations: No    Attends Engineer, Structural: Not on file    Marital Status: Married     Family History:  The patient's family history includes Arthritis in his father and mother; COPD in his paternal grandfather; Diabetes in his father and paternal grandmother; Hearing loss in his father; Heart attack in his father and mother; Heart disease in his father and mother; Hyperlipidemia in his mother; Hypertension in his mother; Obesity in his father and mother; Stroke in his paternal grandmother; Varicose Veins in his paternal grandmother.   ROS:   Please see the history of present illness.    ROS All other systems reviewed and are negative.     10/08/2023    2:35 PM 10/08/2023    2:18 PM  PAD Screen  Previous PAD dx? No No  Previous surgical procedure? No No  Pain with walking? No No  Subsides with rest?  Yes  Feet/toe relief with dangling? No No  Painful, non-healing ulcers? No No  Extremities discolored? No No       PHYSICAL EXAM:   VS:  BP 136/84   Pulse 91   Ht 5' 11 (1.803 m)   Wt 232 lb (105.2 kg)   SpO2 97%   BMI 32.36 kg/m    GEN: Well nourished, well developed, in no acute distress  HEENT: normal  Neck: no JVD, carotid  bruits, or masses Cardiac: RRR; no murmurs, rubs, or gallops,no edema.  Intact distal pulses bilaterally.  Respiratory:  clear to auscultation bilaterally, normal work of breathing GI: soft, nontender, nondistended, + BS MS: no deformity or atrophy  Skin: warm and dry, no rash Neuro:  Alert and Oriented x 3, Strength and sensation are intact Psych: euthymic mood, full affect  Wt Readings from Last 3 Encounters:  03/10/24 232 lb (105.2 kg)  12/02/23 221 lb 4 oz (100.4 kg)  10/08/23 218 lb  12.8 oz (99.2 kg)      Studies/Labs Reviewed:   Home sleep study, PAP compliance download  Recent Labs: 07/29/2023: ALT 22; BUN 20; Creatinine, Ser 1.15; Hemoglobin 15.5; Platelets 230; Potassium 4.6; Sodium 141     ASSESSMENT:    1. OSA on CPAP      PLAN:  In order of problems listed above:  OSA - The patient is tolerating PAP therapy well without any problems. The PAP download performed by his DME was personally reviewed and interpreted by me today and showed an AHI of 1.9 /hr on auto CPAP 5-20 cm H2O with 100% % compliance in using more than 4 hours nightly.  He averages 7 hours nightly with a 95th percentile pressure of 19.8 cm H2O.  The patient has been using and benefiting from PAP use and will continue to benefit from therapy.   Follow-up with me in 1 year   Time Spent: 20 minutes total time of encounter, including 15 minutes spent in face-to-face patient care on the date of this encounter. This time includes coordination of care and counseling regarding above mentioned problem list. Remainder of non-face-to-face time involved reviewing chart documents/testing relevant to the patient encounter and documentation in the medical record. I have independently reviewed documentation from referring provider  Medication Adjustments/Labs and Tests Ordered: Current medicines are reviewed at length with the patient today.  Concerns regarding medicines are outlined above.  Medication changes,  Labs and Tests ordered today are listed in the Patient Instructions below.  There are no Patient Instructions on file for this visit.   Signed, Wilbert Bihari, MD  03/10/2024 2:27 PM    [facility] [address] [place], [place], [place]  [address] Phone: [phone]; Fax: [phone]

## 2024-03-10 NOTE — Telephone Encounter (Signed)
-----   Message from Wilbert Bihari sent at 03/10/2024  2:28 PM EST ----- Order new CPAP supplies

## 2024-03-10 NOTE — Patient Instructions (Signed)

## 2024-03-10 NOTE — Telephone Encounter (Signed)
Order placed to [place] via community message

## 2024-03-14 DIAGNOSIS — G4733 Obstructive sleep apnea (adult) (pediatric): Secondary | ICD-10-CM | POA: Diagnosis not present

## 2024-03-17 DIAGNOSIS — Z87891 Personal history of nicotine dependence: Secondary | ICD-10-CM | POA: Diagnosis not present

## 2024-03-17 DIAGNOSIS — F1290 Cannabis use, unspecified, uncomplicated: Secondary | ICD-10-CM | POA: Diagnosis not present

## 2024-03-17 DIAGNOSIS — F1010 Alcohol abuse, uncomplicated: Secondary | ICD-10-CM | POA: Diagnosis not present

## 2024-03-17 DIAGNOSIS — Y909 Presence of alcohol in blood, level not specified: Secondary | ICD-10-CM | POA: Diagnosis not present

## 2024-03-20 ENCOUNTER — Encounter: Payer: Self-pay | Admitting: Cardiology

## 2024-03-23 NOTE — Telephone Encounter (Signed)
 Reached out to [facility] spoke to Iona and she states the patient can order (1) cushion when he calls the supply team until eligible for the rest of your supplies on 12/27.   Supply Team # [id number].   He has a shipment shipment coming out 12/27.   Call the [place] office and ask to speak to [place] with questions

## 2024-03-24 DIAGNOSIS — Z556 Problems related to health literacy: Secondary | ICD-10-CM | POA: Diagnosis not present

## 2024-03-24 DIAGNOSIS — F1090 Alcohol use, unspecified, uncomplicated: Secondary | ICD-10-CM | POA: Diagnosis not present

## 2024-03-24 DIAGNOSIS — N19 Unspecified kidney failure: Secondary | ICD-10-CM | POA: Diagnosis not present

## 2024-04-13 DIAGNOSIS — G4733 Obstructive sleep apnea (adult) (pediatric): Secondary | ICD-10-CM | POA: Diagnosis not present

## 2024-05-14 ENCOUNTER — Other Ambulatory Visit (HOSPITAL_COMMUNITY): Payer: Self-pay

## 2024-06-12 ENCOUNTER — Ambulatory Visit: Admitting: Family Medicine
